# Patient Record
Sex: Female | Born: 1980 | Race: White | Hispanic: No | Marital: Married | Smoking: Never smoker
Health system: Southern US, Community
[De-identification: ages and names within clinical notes are randomized; demographics above are authoritative.]

## PROBLEM LIST (undated history)

## (undated) DIAGNOSIS — E039 Hypothyroidism, unspecified: Secondary | ICD-10-CM

## (undated) DIAGNOSIS — E282 Polycystic ovarian syndrome: Secondary | ICD-10-CM

## (undated) DIAGNOSIS — F329 Major depressive disorder, single episode, unspecified: Secondary | ICD-10-CM

## (undated) DIAGNOSIS — F32A Depression, unspecified: Secondary | ICD-10-CM

## (undated) DIAGNOSIS — F4310 Post-traumatic stress disorder, unspecified: Secondary | ICD-10-CM

## (undated) DIAGNOSIS — F419 Anxiety disorder, unspecified: Secondary | ICD-10-CM

## (undated) DIAGNOSIS — E785 Hyperlipidemia, unspecified: Secondary | ICD-10-CM

## (undated) DIAGNOSIS — T7840XA Allergy, unspecified, initial encounter: Secondary | ICD-10-CM

## (undated) DIAGNOSIS — E669 Obesity, unspecified: Secondary | ICD-10-CM

## (undated) HISTORY — DX: Obesity, unspecified: E66.9

## (undated) HISTORY — DX: Polycystic ovarian syndrome: E28.2

## (undated) HISTORY — DX: Hypothyroidism, unspecified: E03.9

## (undated) HISTORY — DX: Major depressive disorder, single episode, unspecified: F32.9

## (undated) HISTORY — DX: Allergy, unspecified, initial encounter: T78.40XA

## (undated) HISTORY — DX: Anxiety disorder, unspecified: F41.9

## (undated) HISTORY — DX: Depression, unspecified: F32.A

## (undated) HISTORY — DX: Post-traumatic stress disorder, unspecified: F43.10

## (undated) HISTORY — DX: Hyperlipidemia, unspecified: E78.5

---

## 2003-07-23 HISTORY — PX: COLONOSCOPY: SHX174

## 2010-07-22 HISTORY — PX: SHOULDER SURGERY: SHX246

## 2013-06-03 ENCOUNTER — Ambulatory Visit (INDEPENDENT_AMBULATORY_CARE_PROVIDER_SITE_OTHER): Payer: Managed Care, Other (non HMO) | Admitting: Family Medicine

## 2013-06-03 VITALS — BP 142/92 | HR 78 | Temp 98.2°F | Resp 16 | Ht 66.0 in | Wt 310.0 lb

## 2013-06-03 DIAGNOSIS — R21 Rash and other nonspecific skin eruption: Secondary | ICD-10-CM

## 2013-06-03 DIAGNOSIS — L304 Erythema intertrigo: Secondary | ICD-10-CM

## 2013-06-03 DIAGNOSIS — E039 Hypothyroidism, unspecified: Secondary | ICD-10-CM

## 2013-06-03 DIAGNOSIS — L538 Other specified erythematous conditions: Secondary | ICD-10-CM

## 2013-06-03 LAB — GLUCOSE, POCT (MANUAL RESULT ENTRY): POC Glucose: 73 mg/dl (ref 70–99)

## 2013-06-03 MED ORDER — FLUCONAZOLE 100 MG PO TABS: 150.0000 mg | ORAL_TABLET | Freq: Once | ORAL | Status: DC

## 2013-06-03 MED ORDER — DOXYCYCLINE HYCLATE 100 MG PO CAPS: 100.0000 mg | ORAL_CAPSULE | Freq: Two times a day (BID) | ORAL | Status: DC

## 2013-06-03 NOTE — Progress Notes (Signed)
Subjective: Patient has a history of hypothyroidism. She recently moved here from [place] when her husband got a job here. She does not work currently. She is doing well on her medicine but needs a new prescription she has a rash between her breasts which she's had for some time off and on. She also has a red place on the lateral aspect of the right breast which is concerned now.  Objective: Her neck supple without nodes thyromegaly. Breasts are symmetrical, full. No axillary nodes. She has a intertriginous rash between her breasts. She has a few little specks of folliculitis on the lateral aspect of left breast. Right breast has a 1.5 CM area of erythema and mild thickening with a little or in the Center of it in the right breast at about the 9:00 position. He does not feel like a form of cyst, though it looks like an inflamed pore.  Assessment: Hypothyroidism Erythematous rash right breast Intertrigo  Plan: TSH, hemoglobin A1c, glucose Results for orders placed in visit on 06/03/13  GLUCOSE, POCT (MANUAL RESULT ENTRY)      Result Value Range   POC Glucose 73  70 - 99 mg/dl  POCT GLYCOSYLATED HEMOGLOBIN (HGB A1C)      Result Value Range   Hemoglobin A1C 5.2     Will treat the fungal infection with antifungal's Will treat the red area on the right breast with some cephalexin. This may be an inflamed. This caused by some bacterial agent. It hasn't yet formed a cyst. However if the place is not clear on up over the next 5-7 days she's been cautioned to come back in for a recheck so we don't miss anything. Continue same thyroid

## 2013-06-03 NOTE — Patient Instructions (Signed)
Take the doxycycline one twice daily for the red spot on your breast. If it is not doing better by next week return for recheck.  Take the Diflucan one daily for a rash between breasts. You may also use a little Lotrimin (clotrimazole) or Mycelex(miconazole) cream on the rash   Continue same thyroid medication

## 2013-06-04 LAB — TSH: TSH: 4.003 u[IU]/mL (ref 0.350–4.500)

## 2013-06-05 MED ORDER — LEVOTHYROXINE SODIUM 125 MCG PO TABS: 125.0000 ug | ORAL_TABLET | Freq: Every day | ORAL | Status: DC

## 2013-06-05 NOTE — Addendum Note (Signed)
Addended by: HOPPER, DAVID H on: 06/05/2013 04:46 PM   Modules accepted: Orders

## 2013-07-01 ENCOUNTER — Ambulatory Visit (INDEPENDENT_AMBULATORY_CARE_PROVIDER_SITE_OTHER): Payer: Managed Care, Other (non HMO) | Admitting: Internal Medicine

## 2013-07-01 VITALS — BP 130/82 | HR 71 | Temp 98.0°F | Resp 18 | Ht 66.0 in | Wt 308.0 lb

## 2013-07-01 DIAGNOSIS — J329 Chronic sinusitis, unspecified: Secondary | ICD-10-CM

## 2013-07-01 DIAGNOSIS — R05 Cough: Secondary | ICD-10-CM

## 2013-07-01 MED ORDER — AZITHROMYCIN 500 MG PO TABS: 500.0000 mg | ORAL_TABLET | Freq: Every day | ORAL | Status: DC

## 2013-07-01 NOTE — Progress Notes (Signed)
   Subjective:    Patient ID: Tracie Johnston, female    DOB: [birth year], 32 y.o.   MRN: [id number]  HPI    Review of Systems     Objective:   Physical Exam        Assessment & Plan:

## 2013-07-01 NOTE — Progress Notes (Signed)
   Subjective:    Patient ID: Tracie Johnston, female    DOB: Dec 11, 1980, 32 y.o.   MRN: [id number]  Sinusitis This is a new problem. Episode onset: started 4 days ago. The problem has been gradually worsening since onset. There has been no fever. Associated symptoms include congestion, coughing, headaches, sinus pressure, a sore throat and swollen glands. Past treatments include oral decongestants. The treatment provided no relief.   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  HENT: Positive for congestion, postnasal drip, rhinorrhea, sinus pressure, sore throat and trouble swallowing.   Respiratory: Positive for cough. Negative for wheezing.   Gastrointestinal: Negative for nausea, vomiting and diarrhea.  Neurological: Positive for headaches.       Objective:   Physical Exam  Vitals reviewed. Constitutional: She is oriented to person, place, and time. She appears well-nourished. No distress.  HENT:  Head: Normocephalic.  Right Ear: External ear normal.  Left Ear: External ear normal.  Nose: Mucosal edema, rhinorrhea and sinus tenderness present. Right sinus exhibits maxillary sinus tenderness. Right sinus exhibits no frontal sinus tenderness. Left sinus exhibits maxillary sinus tenderness. Left sinus exhibits no frontal sinus tenderness.  Mouth/Throat: Oropharynx is clear and moist.  Cardiovascular: Normal rate.   Pulmonary/Chest: Effort normal. No respiratory distress. She has wheezes. She exhibits no tenderness.  Musculoskeletal: Normal range of motion.  Neurological: She is alert and oriented to person, place, and time. She exhibits normal muscle tone. Coordination normal.  Psychiatric: She has a normal mood and affect.          Assessment & Plan:  Sinusitis Zithromax 500mg

## 2013-07-05 ENCOUNTER — Ambulatory Visit: Payer: Self-pay | Admitting: Internal Medicine

## 2013-08-13 ENCOUNTER — Ambulatory Visit: Payer: Self-pay | Admitting: Internal Medicine

## 2013-08-18 ENCOUNTER — Encounter: Payer: Self-pay | Admitting: Internal Medicine

## 2013-08-18 ENCOUNTER — Ambulatory Visit (INDEPENDENT_AMBULATORY_CARE_PROVIDER_SITE_OTHER): Payer: Managed Care, Other (non HMO) | Admitting: Internal Medicine

## 2013-08-18 VITALS — BP 142/90 | HR 100 | Temp 98.0°F | Ht 66.0 in | Wt 316.0 lb

## 2013-08-18 DIAGNOSIS — E039 Hypothyroidism, unspecified: Secondary | ICD-10-CM

## 2013-08-18 DIAGNOSIS — N926 Irregular menstruation, unspecified: Secondary | ICD-10-CM

## 2013-08-18 DIAGNOSIS — Z0000 Encounter for general adult medical examination without abnormal findings: Secondary | ICD-10-CM

## 2013-08-18 DIAGNOSIS — E282 Polycystic ovarian syndrome: Secondary | ICD-10-CM | POA: Insufficient documentation

## 2013-08-18 MED ORDER — METFORMIN HCL ER (MOD) 500 MG PO TB24: 500.0000 mg | ORAL_TABLET | Freq: Every day | ORAL | Status: DC

## 2013-08-18 MED ORDER — LEVOTHYROXINE SODIUM 150 MCG PO TABS: 150.0000 ug | ORAL_TABLET | Freq: Every day | ORAL | Status: DC

## 2013-08-18 NOTE — Patient Instructions (Addendum)
Monitor your blood pressure 3-4 times per week with automated BP cuff Complete blood work 2-3 days before next office visit

## 2013-08-18 NOTE — Assessment & Plan Note (Signed)
Patient has history of PCOS. She has tried regular metformin in the past but could not tolerate due to gastrointestinal side effects. Trial of Glumetza. Samples provided. Patient requests referral to gynecologist. Her and her husband are to trying to conceive.

## 2013-08-18 NOTE — Assessment & Plan Note (Signed)
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 Her recent TSH was slightly greater than 4. Increase levothyroxine dose to 150 mcg once daily. Arrange followup TSH in 6-8 weeks.

## 2013-08-18 NOTE — Progress Notes (Signed)
Subjective:    Patient ID: Tracie ReichertLisa Schwalb, female    DOB: 1980/10/16, 33 y.o.   MRN: [id number]  HPI  33 year old white female with history of hypothyroidism and morbid obesity to establish. Patient reports being diagnosed with hypothyroidism 34 years ago. 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 She is currently taking 125 mcg of levothyroxine.  She also reports history of irregular periods and diagnosis of polycystic ovarian syndrome. Her recent A1c was unremarkable. Her previous PCP tried metformin but she couldn't tolerate secondary to gastrointestinal side effects.  Her menstrual cycles are irregular. She and her husband are trying to conceive.  Patient also complains of history of compressive neuropathy. When she sits for too long on a hard surface she gets bilateral pins and needles in her feet. She reports undergoing nerve conduction study.  She denies chronic low back pain.   Review of Systems  Constitutional: Negative for activity change, appetite change and unexpected weight change.  Eyes: Negative for visual disturbance.  Respiratory: Negative for cough, chest tightness and shortness of breath.   Cardiovascular: Negative for chest pain.  Genitourinary: Negative for difficulty urinating.  Neurological: Negative for headaches.  Gastrointestinal: Negative for abdominal pain, heartburn melena or hematochezia Psych: Negative for depression or anxiety Endo:  No polyuria or polydypsia        Past Medical History  Diagnosis Date  . Allergy   . Thyroid disease   . Hypothyroid   . Anxiety   . PCOS (polycystic ovarian syndrome)   . Hyperlipidemia     History   Social History  . Marital Status: Married    Spouse Name: N/A    Number of Children: N/A  . Years of Education: N/A   Occupational History  . Not on file.   Social History Main Topics  . Smoking status: Never Smoker   . Smokeless tobacco: Not on file  . Alcohol Use: Yes  . Drug Use: No  . Sexual  Activity: Not on file   Other Topics Concern  . Not on file   Social History Narrative  . No narrative on file    Past Surgical History  Procedure Laterality Date  . Shoulder surgery      Family History  Problem Relation Age of Onset  . Kidney cancer Maternal Grandmother   . Hyperlipidemia Maternal Grandfather   . Heart disease Maternal Grandfather   . Skin cancer Maternal Grandfather   . Prostate cancer Maternal Grandfather   . Colon cancer Maternal Grandfather   . Diabetes Paternal Grandmother     Allergies  Allergen Reactions  . Penicillins Hives  . Sulfa Antibiotics Hives    No current outpatient prescriptions on file prior to visit.   No current facility-administered medications on file prior to visit.    BP 142/90  Pulse 100  Temp(Src) 98 F (36.7 C) (Oral)  Ht 5\' 6"  (1.676 m)  Wt 316 lb (143.337 kg)  BMI 51.03 kg/m2    Objective:   Physical Exam  Constitutional: She is oriented to person, place, and time. She appears well-developed and well-nourished. No distress.  Pleasant, obese  HENT:  Head: Normocephalic and atraumatic.  Right Ear: External ear normal.  Left Ear: External ear normal.  Mouth/Throat: Oropharynx is clear and moist.  Eyes: Conjunctivae and EOM are normal. Pupils are equal, round, and reactive to light.  No defects in peripheral vision  Neck: Neck supple.  Cardiovascular: Normal rate, regular rhythm, normal heart sounds and intact distal pulses.  No murmur heard. Pulmonary/Chest: Effort normal and breath sounds normal. She has no wheezes.  Abdominal: Soft. Bowel sounds are normal. She exhibits no mass. There is no tenderness.  Musculoskeletal: She exhibits no edema.  Lymphadenopathy:    She has no cervical adenopathy.  Neurological: She is alert and oriented to person, place, and time. She has normal reflexes. She displays normal reflexes. No cranial nerve deficit. She exhibits normal muscle tone. Coordination normal.  Skin:  Skin is warm and dry. No erythema.  Psychiatric: She has a normal mood and affect. Her behavior is normal.          Assessment & Plan:

## 2013-08-23 ENCOUNTER — Telehealth: Payer: Self-pay | Admitting: Internal Medicine

## 2013-08-23 DIAGNOSIS — R7401 Elevation of levels of liver transaminase levels: Secondary | ICD-10-CM

## 2013-08-23 DIAGNOSIS — R740 Nonspecific elevation of levels of transaminase and lactic acid dehydrogenase [LDH]: Principal | ICD-10-CM

## 2013-08-23 NOTE — Telephone Encounter (Signed)
Fax [phone] [facility] Pt's info from dr Artist Paisyoo

## 2014-01-22 ENCOUNTER — Ambulatory Visit (INDEPENDENT_AMBULATORY_CARE_PROVIDER_SITE_OTHER): Payer: Managed Care, Other (non HMO) | Admitting: Internal Medicine

## 2014-01-22 VITALS — BP 128/82 | HR 83 | Temp 98.3°F | Resp 16 | Ht 66.5 in | Wt 310.0 lb

## 2014-01-22 DIAGNOSIS — M79643 Pain in unspecified hand: Secondary | ICD-10-CM

## 2014-01-22 DIAGNOSIS — M79609 Pain in unspecified limb: Secondary | ICD-10-CM

## 2014-01-22 DIAGNOSIS — M2550 Pain in unspecified joint: Secondary | ICD-10-CM

## 2014-01-22 DIAGNOSIS — E039 Hypothyroidism, unspecified: Secondary | ICD-10-CM

## 2014-01-22 MED ORDER — MELOXICAM 15 MG PO TABS: 15.0000 mg | ORAL_TABLET | Freq: Every day | ORAL | Status: DC

## 2014-01-22 MED ORDER — HYDROCODONE-ACETAMINOPHEN 5-325 MG PO TABS
1.0000 | ORAL_TABLET | Freq: Four times a day (QID) | ORAL | Status: DC | PRN
Start: 2014-01-22 — End: 2014-02-11

## 2014-01-22 NOTE — Progress Notes (Addendum)
Subjective:  This chart was scribed for Tracie Lin, MD by [name], Medial Scribe. This patient was seen in room 1 and the patient's care was started at 4:14 PM.    Patient ID: Tracie Johnston, female    DOB: Dec 19, 1980, 33 y.o.   MRN: [id number]  HPI HPI Comments: Tracie Johnston is a 33 y.o. female who presents to the [facility] complaining of  Chief Complaint  Patient presents with  . Ankle Pain    1 month  . Wrist Pain  . 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 She has had some swelling in the ankles this week. The pain is so severe it keeps her up at night. No history of arthristis or gout. No redness. No known injury. She feels her pain is directly related to how hard she has been working.   History of hemorrhoids. Patient reports single occurrence of hematochezia prior to arrival. Patient shares that she has been taking a lot of ibuprofen to manage her pain.    Patient Active Problem List   Diagnosis Date Noted  . Hypothyroidism 08/18/2013  . PCOS (polycystic ovarian syndrome) 08/18/2013  . Morbid obesity 08/18/2013   Past Medical History  Diagnosis Date  . Allergy   . Hypothyroid   . Anxiety   . PCOS (polycystic ovarian syndrome)   . Hyperlipidemia    Past Surgical History  Procedure Laterality Date  . Shoulder surgery  2012    Repair of torn labrum  . Colonoscopy  2005    Normal. IBS   Allergies  Allergen Reactions  . Penicillins Hives  . Sulfa Antibiotics Hives   Prior to Admission medications   Medication Sig Start Date End Date Taking? Authorizing Provider  levothyroxine (SYNTHROID, LEVOTHROID) 150 MCG tablet Take 1 tablet (150 mcg total) by mouth daily. 08/18/13  Yes Doe-Hyun R Shawna Orleans, DO  metFORMIN (GLUMETZA) 500 MG (MOD) 24 hr tablet Take 1 tablet (500 mg total) by mouth daily with  breakfast. 08/18/13   Doe-Hyun Kyra Searles, DO   History   Social History  . Marital Status: Married    Spouse Name: N/A    Number of Children: N/A  . Years of Education: N/A   Occupational History  . Not on file.   Social History Main Topics  . Smoking status: Never Smoker   . Smokeless tobacco: Not on file  . Alcohol Use: Yes  . Drug Use: No  . Sexual Activity: Not on file   Other Topics Concern  . Not on file   Social History Narrative  . No narrative on file       Review of Systems  Constitutional: Negative for fever.  Gastrointestinal: Positive for blood in stool. Negative for diarrhea and rectal pain.  Musculoskeletal: Positive for arthralgias and back pain.       Objective:   Physical Exam  Nursing note and vitals reviewed. Constitutional: She is oriented to person, place, and time. She appears well-developed and well-nourished. No distress.  Morbid obesity.   HENT:  Head: Normocephalic and atraumatic.  Eyes: EOM are normal.  Neck: Neck supple. No thyromegaly present.  Cardiovascular: Normal rate.   Pulmonary/Chest: Effort normal. No respiratory distress.  Musculoskeletal: Normal range of motion.  Both ankles are very tender to palpation and ROM without instabiliy. Achilles tender  without enlargement. Not joint effusion or erythema. Calves and thighs are nontender and not swollen. No peripheral edema. Good peripheral pulpes. Knee and hips full ROM without pain. Uncomfortable lying on back in lumbar area but has negative straight leg raise. Wrists are tender to ROM without swelling of redness. Tender at first [facility] joint on left. No bony changes in the hands. Neck and shoulders move freely.   Neurological: She is alert and oriented to person, place, and time.  Skin: Skin is warm and dry.  Psychiatric: She has a normal mood and affect. Her behavior is normal.    Filed Vitals:   01/22/14 1559  BP: 128/82  Pulse: 83  Temp: 98.3 F (36.8 C)  Resp: 16           Assessment & Plan:  I have completed the patient encounter in its entirety as documented by the scribe, with editing by me where necessary. Robert P. Laney Pastor, M.D. Arthralgias-ankles and wrists - Plan: CBC with Differential, ANA, Rheumatoid factor, TSH + free T4, Comprehensive metabolic panel, Uric acid, Sedimentation rate, CANCELED: POCT SEDIMENTATION RATE  Unspecified hypothyroidism  Pain of hand, unspecified laterality  Morbid obesity is part of prob here as is job dissatisfac  Meds ordered this encounter  Medications  . HYDROcodone-acetaminophen (NORCO/VICODIN) 5-325 MG per tablet    Sig: Take 1 tablet by mouth every 6 (six) hours as needed for moderate pain. espec hs    Dispense:  12 tablet    Refill:  0  . meloxicam (MOBIC) 15 MG tablet    Sig: Take 1 tablet (15 mg total) by mouth daily. For joint pain    Dispense:  30 tablet    Refill:  0   Will allow rest +meds and then f/u for plan for conditionong  Results for orders placed in visit on 01/22/14  CBC WITH DIFFERENTIAL      Result Value Ref Range   WBC 8.7  4.0 - 10.5 K/uL   RBC 5.22 (*) 3.87 - 5.11 MIL/uL   Hemoglobin 14.8  12.0 - 15.0 g/dL   HCT 43.6  36.0 - 46.0 %   MCV 83.5  78.0 - 100.0 fL   MCH 28.4  26.0 - 34.0 pg   MCHC 33.9  30.0 - 36.0 g/dL   RDW 14.5  11.5 - 15.5 %   Platelets 339  150 - 400 K/uL   Neutrophils Relative % 64  43 - 77 %   Neutro Abs 5.6  1.7 - 7.7 K/uL   Lymphocytes Relative 20  12 - 46 %   Lymphs Abs 1.7  0.7 - 4.0 K/uL   Monocytes Relative 10  3 - 12 %   Monocytes Absolute 0.9  0.1 - 1.0 K/uL   Eosinophils Relative 5  0 - 5 %   Eosinophils Absolute 0.4  0.0 - 0.7 K/uL   Basophils Relative 1  0 - 1 %   Basophils Absolute 0.1  0.0 - 0.1 K/uL   Smear Review Criteria for review not met    ANA      Result Value Ref Range   ANA    NEGATIVE  RHEUMATOID FACTOR      Result Value Ref Range   Rheumatoid Factor <10  <=14 IU/mL  COMPREHENSIVE METABOLIC PANEL      Result Value Ref Range    Sodium 139  135 - 145 mEq/L   Potassium 4.1  3.5 - 5.3 mEq/L   Chloride 101  96 - 112  mEq/L   CO2 26  19 - 32 mEq/L   Glucose, Bld 84  70 - 99 mg/dL   BUN 13  6 - 23 mg/dL   Creat 0.66  0.50 - 1.10 mg/dL   Total Bilirubin 0.6  0.2 - 1.2 mg/dL   Alkaline Phosphatase 89  39 - 117 U/L   AST 25  0 - 37 U/L   ALT 34  0 - 35 U/L   Total Protein 6.8  6.0 - 8.3 g/dL   Albumin 4.1  3.5 - 5.2 g/dL   Calcium 9.5  8.4 - 10.5 mg/dL  URIC ACID      Result Value Ref Range   Uric Acid, Serum 7.0  2.4 - 7.0 mg/dL  SEDIMENTATION RATE      Result Value Ref Range   Sed Rate 20  0 - 22 mm/hr   I have completed the patient encounter in its entirety as documented by the scribe, with editing by me where necessary. Robert P. Laney Pastor, M.D.

## 2014-01-23 LAB — CBC WITH DIFFERENTIAL/PLATELET
BASOS PCT: 1 % (ref 0–1)
Basophils Absolute: 0.1 10*3/uL (ref 0.0–0.1)
Eosinophils Absolute: 0.4 10*3/uL (ref 0.0–0.7)
Eosinophils Relative: 5 % (ref 0–5)
HEMATOCRIT: 43.6 % (ref 36.0–46.0)
Hemoglobin: 14.8 g/dL (ref 12.0–15.0)
Lymphocytes Relative: 20 % (ref 12–46)
Lymphs Abs: 1.7 10*3/uL (ref 0.7–4.0)
MCH: 28.4 pg (ref 26.0–34.0)
MCHC: 33.9 g/dL (ref 30.0–36.0)
MCV: 83.5 fL (ref 78.0–100.0)
MONO ABS: 0.9 10*3/uL (ref 0.1–1.0)
MONOS PCT: 10 % (ref 3–12)
Neutro Abs: 5.6 10*3/uL (ref 1.7–7.7)
Neutrophils Relative %: 64 % (ref 43–77)
Platelets: 339 10*3/uL (ref 150–400)
RBC: 5.22 MIL/uL — ABNORMAL HIGH (ref 3.87–5.11)
RDW: 14.5 % (ref 11.5–15.5)
WBC: 8.7 10*3/uL (ref 4.0–10.5)

## 2014-01-23 LAB — COMPREHENSIVE METABOLIC PANEL
ALK PHOS: 89 U/L (ref 39–117)
ALT: 34 U/L (ref 0–35)
AST: 25 U/L (ref 0–37)
Albumin: 4.1 g/dL (ref 3.5–5.2)
BILIRUBIN TOTAL: 0.6 mg/dL (ref 0.2–1.2)
BUN: 13 mg/dL (ref 6–23)
CO2: 26 meq/L (ref 19–32)
Calcium: 9.5 mg/dL (ref 8.4–10.5)
Chloride: 101 mEq/L (ref 96–112)
Creat: 0.66 mg/dL (ref 0.50–1.10)
GLUCOSE: 84 mg/dL (ref 70–99)
Potassium: 4.1 mEq/L (ref 3.5–5.3)
Sodium: 139 mEq/L (ref 135–145)
Total Protein: 6.8 g/dL (ref 6.0–8.3)

## 2014-01-23 LAB — SEDIMENTATION RATE: SED RATE: 20 mm/h (ref 0–22)

## 2014-01-23 LAB — RHEUMATOID FACTOR

## 2014-01-23 LAB — URIC ACID: Uric Acid, Serum: 7 mg/dL (ref 2.4–7.0)

## 2014-01-24 LAB — ANA: Anti Nuclear Antibody(ANA): NEGATIVE

## 2014-01-25 ENCOUNTER — Encounter: Payer: Self-pay | Admitting: Internal Medicine

## 2014-01-26 ENCOUNTER — Ambulatory Visit (INDEPENDENT_AMBULATORY_CARE_PROVIDER_SITE_OTHER): Payer: Managed Care, Other (non HMO) | Admitting: Emergency Medicine

## 2014-01-26 VITALS — BP 158/81 | HR 92 | Temp 98.2°F | Resp 18 | Wt 310.0 lb

## 2014-01-26 DIAGNOSIS — M2550 Pain in unspecified joint: Secondary | ICD-10-CM

## 2014-01-26 MED ORDER — NAPROXEN SODIUM 550 MG PO TABS: 550.0000 mg | ORAL_TABLET | Freq: Two times a day (BID) | ORAL | Status: DC

## 2014-01-26 NOTE — Patient Instructions (Signed)

## 2014-01-26 NOTE — Progress Notes (Signed)
[facility] [address], [place] [address] [phone]  Date:  01/26/2014   Name:  Tracie ReichertLisa Johnston   DOB:  19-May-1981   MRN:  [id number]  PCP:  Thomos Lemonsobert Yoo, DO    Chief Complaint: arthralgias   History of Present Illness:  Tracie ReichertLisa Johnston is a 33 y.o. 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  At start of job she developed polyarthralgias.  No rash, swelling or bruising.  No fever or chills.  Subsequently quit her job.  She was seen on 7/4 by Dr Merla Richesoolittle and had a panel of labs done, all returned normal.  She is out of vicodin and is not receiving any benefit from the meloxicam.  No history of injury.  No improvement with over the counter medications or other home remedies. Denies other complaint or health concern today.   Patient Active Problem List   Diagnosis Date Noted  . Hypothyroidism 08/18/2013  . PCOS (polycystic ovarian syndrome) 08/18/2013  . Morbid obesity 08/18/2013    Past Medical History  Diagnosis Date  . Allergy   . Hypothyroid   . Anxiety   . PCOS (polycystic ovarian syndrome)   . Hyperlipidemia     Past Surgical History  Procedure Laterality Date  . Shoulder surgery  2012    Repair of torn labrum  . Colonoscopy  2005    Normal. IBS    History  Substance Use Topics  . Smoking status: Never Smoker   . Smokeless tobacco: Not on file  . Alcohol Use: Yes    Family History  Problem Relation Age of Onset  . Kidney cancer Maternal Grandmother   . Hyperlipidemia Maternal Grandfather   . Heart disease Maternal Grandfather   . Skin cancer Maternal Grandfather   . Prostate cancer Maternal Grandfather   . Colon cancer Maternal Grandfather   . Diabetes Paternal Grandmother     Allergies  Allergen Reactions  . Penicillins Hives  . Sulfa Antibiotics Hives    Medication list has been reviewed and updated.  Current Outpatient Prescriptions on File Prior to Visit   Medication Sig Dispense Refill  . HYDROcodone-acetaminophen (NORCO/VICODIN) 5-325 MG per tablet Take 1 tablet by mouth every 6 (six) hours as needed for moderate pain. espec hs  12 tablet  0  . levothyroxine (SYNTHROID, LEVOTHROID) 150 MCG tablet Take 1 tablet (150 mcg total) by mouth daily.  90 tablet  1  . meloxicam (MOBIC) 15 MG tablet Take 1 tablet (15 mg total) by mouth daily. For joint pain  30 tablet  0  . metFORMIN (GLUMETZA) 500 MG (MOD) 24 hr tablet Take 1 tablet (500 mg total) by mouth daily with breakfast.  60 tablet  0   No current facility-administered medications on file prior to visit.    Review of Systems:  As per HPI, otherwise negative.    Physical Examination: Filed Vitals:   01/26/14 1814  BP: 158/81  Pulse: 92  Temp: 98.2 F (36.8 C)  Resp: 18   Filed Vitals:   01/26/14 1814  Weight: 310 lb (140.615 kg)   Body mass index is 49.29 kg/(m^2). Ideal Body Weight:     GEN: WDWN, NAD, Non-toxic, Alert & Oriented x 3 HEENT: Atraumatic, Normocephalic.  Ears and Nose: No external deformity. EXTR: No clubbing/cyanosis/edema NEURO: Normal gait.  PSYCH: Normally interactive. Conversant. Not depressed or anxious appearing.  Calm demeanor.    Assessment  and Plan: polyarthralgias Anaprox Labs   Signed,  Phillips OdorJeffery Trevan Messman, MD

## 2014-01-28 LAB — ROCKY MTN SPOTTED FVR AB, IGM-BLOOD: ROCKY MTN SPOTTED FEVER, IGM: 0.17 IV

## 2014-01-28 LAB — LYME AB/WESTERN BLOT REFLEX: B burgdorferi Ab IgG+IgM: 0.57 {ISR}

## 2014-02-11 ENCOUNTER — Ambulatory Visit (INDEPENDENT_AMBULATORY_CARE_PROVIDER_SITE_OTHER): Payer: Managed Care, Other (non HMO) | Admitting: Family Medicine

## 2014-02-11 ENCOUNTER — Encounter: Payer: Self-pay | Admitting: Family Medicine

## 2014-02-11 VITALS — BP 122/78 | HR 92 | Temp 98.1°F | Ht 66.5 in | Wt 308.0 lb

## 2014-02-11 DIAGNOSIS — M25539 Pain in unspecified wrist: Secondary | ICD-10-CM

## 2014-02-11 DIAGNOSIS — M25531 Pain in right wrist: Secondary | ICD-10-CM

## 2014-02-11 DIAGNOSIS — Z0000 Encounter for general adult medical examination without abnormal findings: Secondary | ICD-10-CM

## 2014-02-11 DIAGNOSIS — E039 Hypothyroidism, unspecified: Secondary | ICD-10-CM

## 2014-02-11 DIAGNOSIS — M25532 Pain in left wrist: Secondary | ICD-10-CM

## 2014-02-11 DIAGNOSIS — M25579 Pain in unspecified ankle and joints of unspecified foot: Secondary | ICD-10-CM

## 2014-02-11 DIAGNOSIS — E282 Polycystic ovarian syndrome: Secondary | ICD-10-CM

## 2014-02-11 LAB — HEPATIC FUNCTION PANEL
ALT: 38 U/L — AB (ref 0–35)
AST: 22 U/L (ref 0–37)
Albumin: 3.9 g/dL (ref 3.5–5.2)
Alkaline Phosphatase: 83 U/L (ref 39–117)
Bilirubin, Direct: 0.1 mg/dL (ref 0.0–0.3)
Total Bilirubin: 0.8 mg/dL (ref 0.2–1.2)
Total Protein: 7.2 g/dL (ref 6.0–8.3)

## 2014-02-11 LAB — BASIC METABOLIC PANEL
BUN: 14 mg/dL (ref 6–23)
CALCIUM: 9.2 mg/dL (ref 8.4–10.5)
CHLORIDE: 102 meq/L (ref 96–112)
CO2: 28 mEq/L (ref 19–32)
CREATININE: 0.7 mg/dL (ref 0.4–1.2)
GFR: 103.17 mL/min (ref >60.00)
Glucose, Bld: 79 mg/dL (ref 70–99)
Potassium: 3.7 mEq/L (ref 3.5–5.1)
Sodium: 137 mEq/L (ref 135–145)

## 2014-02-11 LAB — CBC WITH DIFFERENTIAL/PLATELET
Basophils Absolute: 0 10*3/uL (ref 0.0–0.1)
Basophils Relative: 0.3 % (ref 0.0–3.0)
EOS PCT: 4.1 % (ref 0.0–5.0)
Eosinophils Absolute: 0.4 10*3/uL (ref 0.0–0.7)
HCT: 45.7 % (ref 36.0–46.0)
Hemoglobin: 15.3 g/dL — ABNORMAL HIGH (ref 12.0–15.0)
LYMPHS PCT: 21.5 % (ref 12.0–46.0)
Lymphs Abs: 2 10*3/uL (ref 0.7–4.0)
MCHC: 33.5 g/dL (ref 30.0–36.0)
MCV: 85.3 fl (ref 78.0–100.0)
Monocytes Absolute: 0.7 10*3/uL (ref 0.1–1.0)
Monocytes Relative: 7.8 % (ref 3.0–12.0)
NEUTROS PCT: 66.3 % (ref 43.0–77.0)
Neutro Abs: 6.1 10*3/uL (ref 1.4–7.7)
Platelets: 365 10*3/uL (ref 150.0–400.0)
RBC: 5.36 Mil/uL — AB (ref 3.87–5.11)
RDW: 13.8 % (ref 11.5–15.5)
WBC: 9.3 10*3/uL (ref 4.0–10.5)

## 2014-02-11 LAB — LIPID PANEL
CHOLESTEROL: 216 mg/dL — AB (ref 0–200)
HDL: 29.1 mg/dL — ABNORMAL LOW (ref >39.00)
LDL Cholesterol: 158 mg/dL — ABNORMAL HIGH (ref 0–99)
NonHDL: 186.9
TRIGLYCERIDES: 147 mg/dL (ref 0.0–149.0)
Total CHOL/HDL Ratio: 7
VLDL: 29.4 mg/dL (ref 0.0–40.0)

## 2014-02-11 LAB — TSH: TSH: 1.17 u[IU]/mL (ref 0.35–4.50)

## 2014-02-11 LAB — FOLLICLE STIMULATING HORMONE: FSH: 5.7 m[IU]/mL

## 2014-02-11 LAB — LUTEINIZING HORMONE: LH: 4.95 m[IU]/mL

## 2014-02-11 NOTE — Progress Notes (Signed)
Pre visit review using our clinic review tool, if applicable. No additional management support is needed unless otherwise documented below in the visit note. 

## 2014-02-11 NOTE — Progress Notes (Signed)
No chief complaint on file.   HPI:  Tracie Johnston, is a 33 yo F patient of Dr. Shawna Orleans presenting for acute visit for:  Pain in Ankles: -started after new active job at [employer] a month ago in the heat that she is not used to -seen by Dr. Ouida Sills and Dr. Laney Pastor over the last month requesting pain medicine -has extensive laboratory evaluation with CBC, CMP, TFT (ordered but not done), ESR, Uric acid, ANA, RF, Lyme and RMSF serology all ok -reports today: doing better, swelling has improved, reports has a bump on each foot she wants me to look at, reports has pain in both thumbs and wrist -reports chronic intermittent issues with knees and plantar fasciitis -denies: fevers, malaise, HA, rash, FH rheumatoid disease, depression  Hypothyroidism:  -wants to check labs as reports was not done -hx of hypothyroidism -on synthroid -denies: fevers, heat or cold intolerance, skin changes  ROS: See pertinent positives and negatives per HPI.  Past Medical History  Diagnosis Date  . Allergy   . Hypothyroid   . Anxiety   . PCOS (polycystic ovarian syndrome)   . Hyperlipidemia     Past Surgical History  Procedure Laterality Date  . Shoulder surgery  2012    Repair of torn labrum  . Colonoscopy  2005    Normal. IBS    Family History  Problem Relation Age of Onset  . Kidney cancer Maternal Grandmother   . Hyperlipidemia Maternal Grandfather   . Heart disease Maternal Grandfather   . Skin cancer Maternal Grandfather   . Prostate cancer Maternal Grandfather   . Colon cancer Maternal Grandfather   . Diabetes Paternal Grandmother     History   Social History  . Marital Status: Married    Spouse Name: N/A    Number of Children: N/A  . Years of Education: N/A   Social History Main Topics  . Smoking status: Never Smoker   . Smokeless tobacco: None  . Alcohol Use: Yes  . Drug Use: No  . Sexual Activity: None   Other Topics Concern  . None   Social History Narrative  . None     Current outpatient prescriptions:levothyroxine (SYNTHROID, LEVOTHROID) 150 MCG tablet, Take 1 tablet (150 mcg total) by mouth daily., Disp: 90 tablet, Rfl: 1;  naproxen sodium (ANAPROX DS) 550 MG tablet, Take 1 tablet (550 mg total) by mouth 2 (two) times daily with a meal., Disp: 40 tablet, Rfl: 0  EXAM:  Filed Vitals:   02/11/14 1334  BP: 122/78  Pulse: 92  Temp: 98.1 F (36.7 C)    Body mass index is 48.97 kg/(m^2).  GENERAL: vitals reviewed and listed above, alert, oriented, appears well hydrated and in no acute distress  HEENT: atraumatic, conjunttiva clear, no obvious abnormalities on inspection of external nose and ears  NECK: no obvious masses on inspection  LUNGS: clear to auscultation bilaterally, no wheezes, rales or rhonchi, good air movement  CV: HRRR, no peripheral edema  MS: moves all extremities without noticeable abnormality -normal gait, normal appearance of both ankles and wrists with normal ROM, strength and sensation, no laxity, mild tendonous TTP, bumps are normal bony anatomy  PSYCH: pleasant and cooperative, no obvious depression or anxiety  ASSESSMENT AND PLAN:  Discussed the following assessment and plan:  Hypothyroidism, unspecified hypothyroidism type  Pain in both wrists  Pain in joint, ankle and foot, unspecified laterality  Preventative health care - Plan: Hepatic function panel, Lipid panel, Basic metabolic panel, CBC with  Differential  Unspecified hypothyroidism - Plan: TSH  PCOS (polycystic ovarian syndrome) - Plan: Luteinizing Hormone, Follicle Stimulating Hormone  -looks like has standing order in for thyroid check with PCP, will order if lab does not honor --we discussed possible serious and likely etiologies, workup and treatment, treatment risks and return precautions for the ankle and wrist pain that seems to be improving and think is more related to weight and new activity level -after this discussion, Asuzena opted for HEP  for both, strassburg sock, wrist splint with close follow up and possibly referral to rheum if persistent pain in multiple areas -of course, we advised Meagan  to return or notify a doctor immediately if symptoms worsen or persist or new concerns arise.  .  -Patient advised to return or notify a doctor immediately if symptoms worsen or persist or new concerns arise.  Patient Instructions  -try strassburg sock for plantar fasciitis and tight gastrocs  -trial of wrist splints  -follow up with Dr. Shawna Orleans in about 1 month       KIM, Nickola Major.

## 2014-02-11 NOTE — Patient Instructions (Signed)
-  try strassburg sock for plantar fasciitis and tight gastrocs  -trial of wrist splints  -follow up with Dr. Artist PaisYoo in about 1 month

## 2014-03-11 ENCOUNTER — Ambulatory Visit (INDEPENDENT_AMBULATORY_CARE_PROVIDER_SITE_OTHER): Payer: Managed Care, Other (non HMO) | Admitting: Internal Medicine

## 2014-03-11 ENCOUNTER — Encounter: Payer: Self-pay | Admitting: Internal Medicine

## 2014-03-11 VITALS — BP 132/74 | Temp 98.1°F | Ht 66.5 in | Wt 304.0 lb

## 2014-03-11 DIAGNOSIS — E038 Other specified hypothyroidism: Secondary | ICD-10-CM

## 2014-03-11 DIAGNOSIS — E039 Hypothyroidism, unspecified: Secondary | ICD-10-CM

## 2014-03-11 DIAGNOSIS — M2550 Pain in unspecified joint: Secondary | ICD-10-CM

## 2014-03-11 DIAGNOSIS — E282 Polycystic ovarian syndrome: Secondary | ICD-10-CM

## 2014-03-11 DIAGNOSIS — R740 Nonspecific elevation of levels of transaminase and lactic acid dehydrogenase [LDH]: Secondary | ICD-10-CM

## 2014-03-11 DIAGNOSIS — R7402 Elevation of levels of lactic acid dehydrogenase (LDH): Secondary | ICD-10-CM | POA: Insufficient documentation

## 2014-03-11 NOTE — Assessment & Plan Note (Signed)
Refer to female GYN.  Patient advised to start prenatal vitamins.

## 2014-03-11 NOTE — Progress Notes (Signed)
   Subjective:    Patient ID: Tracie ReichertLisa Johnston, female    DOB: [birth year], 33 y.o.   MRN: [id number]  HPI  33 year old white female previously seen for unexplained joint pains for followup. Patient's workup was negative (including ANA, rheumatoid factor antibody, testing for Lyme's disease and [facility].)  Fortunately, patient's arthralgias symptoms resolved on their own. She has no swelling in her hands, wrists or ankles.  PCOS -patient's menstrual cycles are still irregular. She is trying to conceive. She would like referral to female gynecologist.  Hypothyroidism - TSH within normal range.  Blood test from July, 2015 showed minimally elevated ALT  Review of Systems Negative for fever or chills, no joint swelling or redness    Past Medical History  Diagnosis Date  . Allergy   . Hypothyroid   . Anxiety   . PCOS (polycystic ovarian syndrome)   . Hyperlipidemia     History   Social History  . Marital Status: Married    Spouse Name: N/A    Number of Children: N/A  . Years of Education: N/A   Occupational History  . Not on file.   Social History Main Topics  . Smoking status: Never Smoker   . Smokeless tobacco: Not on file  . Alcohol Use: Yes  . Drug Use: No  . Sexual Activity: Not on file   Other Topics Concern  . Not on file   Social History Narrative  . No narrative on file    Past Surgical History  Procedure Laterality Date  . Shoulder surgery  2012    Repair of torn labrum  . Colonoscopy  2005    Normal. IBS    Family History  Problem Relation Age of Onset  . Kidney cancer Maternal Grandmother   . Hyperlipidemia Maternal Grandfather   . Heart disease Maternal Grandfather   . Skin cancer Maternal Grandfather   . Prostate cancer Maternal Grandfather   . Colon cancer Maternal Grandfather   . Diabetes Paternal Grandmother     Allergies  Allergen Reactions  . Penicillins Hives  . Sulfa Antibiotics Hives    Current Outpatient  Prescriptions on File Prior to Visit  Medication Sig Dispense Refill  . levothyroxine (SYNTHROID, LEVOTHROID) 150 MCG tablet Take 1 tablet (150 mcg total) by mouth daily.  90 tablet  1  . naproxen sodium (ANAPROX DS) 550 MG tablet Take 1 tablet (550 mg total) by mouth 2 (two) times daily with a meal.  40 tablet  0   No current facility-administered medications on file prior to visit.    BP 132/74  Temp(Src) 98.1 F (36.7 C) (Oral)  Ht 5' 6.5" (1.689 m)  Wt 304 lb (137.893 kg)  BMI 48.34 kg/m2    Objective:   Physical Exam  Constitutional: She is oriented to person, place, and time. She appears well-developed and well-nourished.  Cardiovascular: Normal rate, regular rhythm and normal heart sounds.   Pulmonary/Chest: Effort normal and breath sounds normal. She has no wheezes.  Musculoskeletal: She exhibits no edema.  No joint swelling or redness  Neurological: She is alert and oriented to person, place, and time.  Skin: Skin is warm and dry.  Psychiatric: She has a normal mood and affect. Her behavior is normal.          Assessment & Plan:

## 2014-03-11 NOTE — Assessment & Plan Note (Signed)
Minimally elevated ALT likely secondary to NSAID use. Repeat liver function tests in 6 months.

## 2014-03-11 NOTE — Patient Instructions (Signed)
Please complete the following lab tests before your next follow up appointment: TSH - 244.9 LFTs - 790.4

## 2014-03-11 NOTE — Progress Notes (Signed)
Pre visit review using our clinic review tool, if applicable. No additional management support is needed unless otherwise documented below in the visit note. 

## 2014-03-11 NOTE — Assessment & Plan Note (Signed)
Patient's arthralgias or resolved on its own. Her symptoms were self-limited. We discussed possibility that her symptoms were secondary to post parvovirus infection.

## 2014-03-11 NOTE — Assessment & Plan Note (Signed)
TSH at goal. Monitor q 6 months.

## 2014-03-15 MED ORDER — LEVOTHYROXINE SODIUM 150 MCG PO TABS: 150.0000 ug | ORAL_TABLET | Freq: Every day | ORAL | Status: DC

## 2014-03-15 NOTE — Addendum Note (Signed)
Addended by: Alfred Levins D on: 03/15/2014 09:39 AM   Modules accepted: Orders

## 2014-05-09 ENCOUNTER — Ambulatory Visit (INDEPENDENT_AMBULATORY_CARE_PROVIDER_SITE_OTHER): Payer: Managed Care, Other (non HMO) | Admitting: Family Medicine

## 2014-05-09 ENCOUNTER — Encounter: Payer: Self-pay | Admitting: Family Medicine

## 2014-05-09 VITALS — BP 123/91 | HR 82 | Temp 98.0°F | Ht 66.5 in | Wt 302.0 lb

## 2014-05-09 DIAGNOSIS — J0100 Acute maxillary sinusitis, unspecified: Secondary | ICD-10-CM

## 2014-05-09 MED ORDER — AZITHROMYCIN 250 MG PO TABS: ORAL_TABLET | ORAL | Status: DC

## 2014-05-09 NOTE — Progress Notes (Signed)
Pre visit review using our clinic review tool, if applicable. No additional management support is needed unless otherwise documented below in the visit note. 

## 2014-05-09 NOTE — Progress Notes (Signed)
   Subjective:    Patient ID: Quintella ReichertLisa Clagg, female    DOB: 10-06-1980, 33 y.o.   MRN: [id number]  HPI Here for 4 days of sinus pressure, PND, HA, ST, and a dry cough.    Review of Systems  Constitutional: Negative.   HENT: Positive for congestion, postnasal drip and sinus pressure.   Eyes: Negative.   Respiratory: Positive for cough.        Objective:   Physical Exam  Constitutional: She appears well-developed and well-nourished.  HENT:  Right Ear: External ear normal.  Left Ear: External ear normal.  Nose: Nose normal.  Mouth/Throat: Oropharynx is clear and moist.  Eyes: Conjunctivae are normal.  Pulmonary/Chest: Effort normal and breath sounds normal.  Lymphadenopathy:    She has no cervical adenopathy.          Assessment & Plan:  Add Mucinex .

## 2014-06-23 ENCOUNTER — Ambulatory Visit: Payer: Managed Care, Other (non HMO) | Admitting: Dietician

## 2014-07-01 ENCOUNTER — Ambulatory Visit (INDEPENDENT_AMBULATORY_CARE_PROVIDER_SITE_OTHER): Payer: Managed Care, Other (non HMO) | Admitting: Internal Medicine

## 2014-07-01 ENCOUNTER — Encounter: Payer: Self-pay | Admitting: Internal Medicine

## 2014-07-01 VITALS — BP 132/86 | HR 96 | Temp 98.0°F | Ht 66.5 in | Wt 306.0 lb

## 2014-07-01 DIAGNOSIS — R748 Abnormal levels of other serum enzymes: Secondary | ICD-10-CM

## 2014-07-01 DIAGNOSIS — E039 Hypothyroidism, unspecified: Secondary | ICD-10-CM

## 2014-07-01 DIAGNOSIS — E282 Polycystic ovarian syndrome: Secondary | ICD-10-CM

## 2014-07-01 LAB — HEPATIC FUNCTION PANEL
ALT: 41 U/L — ABNORMAL HIGH (ref 0–35)
AST: 22 U/L (ref 0–37)
Albumin: 3.8 g/dL (ref 3.5–5.2)
Alkaline Phosphatase: 80 U/L (ref 39–117)
BILIRUBIN DIRECT: 0 mg/dL (ref 0.0–0.3)
TOTAL PROTEIN: 7.2 g/dL (ref 6.0–8.3)
Total Bilirubin: 0.7 mg/dL (ref 0.2–1.2)

## 2014-07-01 LAB — HEMOGLOBIN A1C: Hgb A1c MFr Bld: 5.7 % (ref 4.6–6.5)

## 2014-07-01 LAB — TSH: TSH: 3.47 u[IU]/mL (ref 0.35–4.50)

## 2014-07-01 NOTE — Progress Notes (Signed)
   Subjective:    Patient ID: Tracie ReichertLisa Johnston, female    DOB: 08/10/1980, 33 y.o.   MRN: [id number]  HPI  33 year old white female with history of morbid obesity, hypothyroidism and polycystic ovarian syndrome for routine follow-up. Interim medical history-patient reports establishing with gynecologist. She was referred to nutritionist. Patient also prescribed letrozol and medroxyprogesterone. She has not started her medication.  Patient unable to tolerate metformin secondary to gastrointestinal side effects.  Hypothyroidism - weight is stable.   Morbid obesity - She did not have a good visit with her nutritionist.   Review of Systems Increased stress - Her father recently hospitalized with multiple medical problems.    Past Medical History  Diagnosis Date  . Allergy   . Hypothyroid   . Anxiety   . PCOS (polycystic ovarian syndrome)   . Hyperlipidemia     History   Social History  . Marital Status: Married    Spouse Name: N/A    Number of Children: N/A  . Years of Education: N/A   Occupational History  . Not on file.   Social History Main Topics  . Smoking status: Never Smoker   . Smokeless tobacco: Never Used  . Alcohol Use: No  . Drug Use: No  . Sexual Activity: Not on file   Other Topics Concern  . Not on file   Social History Narrative    Past Surgical History  Procedure Laterality Date  . Shoulder surgery  2012    Repair of torn labrum  . Colonoscopy  2005    Normal. IBS    Family History  Problem Relation Age of Onset  . Kidney cancer Maternal Grandmother   . Hyperlipidemia Maternal Grandfather   . Heart disease Maternal Grandfather   . Skin cancer Maternal Grandfather   . Prostate cancer Maternal Grandfather   . Colon cancer Maternal Grandfather   . Diabetes Paternal Grandmother     Allergies  Allergen Reactions  . Penicillins Hives  . Sulfa Antibiotics Hives    Current Outpatient Prescriptions on File Prior to Visit  Medication Sig  Dispense Refill  . levothyroxine (SYNTHROID, LEVOTHROID) 150 MCG tablet Take 1 tablet (150 mcg total) by mouth daily. 90 tablet 1   No current facility-administered medications on file prior to visit.    BP 132/86 mmHg  Pulse 96  Temp(Src) 98 F (36.7 C) (Oral)  Ht 5' 6.5" (1.689 m)  Wt 306 lb (138.801 kg)  BMI 48.66 kg/m2    Objective:   Physical Exam  Constitutional: She is oriented to person, place, and time. She appears well-developed and well-nourished. No distress.  Cardiovascular: Normal rate and normal heart sounds.   No murmur heard. Pulmonary/Chest: Effort normal and breath sounds normal. She has no wheezes.  Abdominal:  Abdominal obesity  Neurological: She is alert and oriented to person, place, and time. No cranial nerve deficit.  Skin: Skin is warm and dry.  Psychiatric: She has a normal mood and affect. Her behavior is normal.          Assessment & Plan:

## 2014-07-01 NOTE — Assessment & Plan Note (Signed)
Monitor TFTs.  Adjust Levothyroxine dose accordingly.

## 2014-07-01 NOTE — Assessment & Plan Note (Addendum)
Patient was referred to gynecologist. GYN recommended weight loss. Patient unable to tolerate metformin due to gastrointestinal side effects.  Patient was prescribed letrozole and medroxyprogesterone.  Check A1c.

## 2014-07-01 NOTE — Progress Notes (Signed)
Pre visit review using our clinic review tool, if applicable. No additional management support is needed unless otherwise documented below in the visit note. 

## 2014-07-19 ENCOUNTER — Telehealth: Payer: Self-pay | Admitting: Internal Medicine

## 2014-07-19 MED ORDER — LEVOTHYROXINE SODIUM 175 MCG PO TABS: 175.0000 ug | ORAL_TABLET | Freq: Every day | ORAL | Status: DC

## 2014-07-19 NOTE — Telephone Encounter (Signed)
Pt was seen on 12/11 and per pt md change synthoid med. Please call into [facility]

## 2014-07-19 NOTE — Telephone Encounter (Signed)
Per Dr Olegario MessierYoo's result note increase to 175mcg, rx sent in electronically

## 2014-07-25 ENCOUNTER — Ambulatory Visit
Admission: RE | Admit: 2014-07-25 | Discharge: 2014-07-25 | Disposition: A | Discharge status: Home / Self Care | Payer: Managed Care, Other (non HMO) | Source: Ambulatory Visit | Attending: Internal Medicine | Admitting: Internal Medicine

## 2014-07-25 DIAGNOSIS — R7401 Elevation of levels of liver transaminase levels: Secondary | ICD-10-CM

## 2014-07-25 DIAGNOSIS — R740 Nonspecific elevation of levels of transaminase and lactic acid dehydrogenase [LDH]: Principal | ICD-10-CM

## 2014-09-06 ENCOUNTER — Other Ambulatory Visit: Payer: Managed Care, Other (non HMO)

## 2014-09-09 ENCOUNTER — Other Ambulatory Visit (INDEPENDENT_AMBULATORY_CARE_PROVIDER_SITE_OTHER): Payer: Managed Care, Other (non HMO)

## 2014-09-09 ENCOUNTER — Encounter: Payer: Self-pay | Admitting: Family Medicine

## 2014-09-09 ENCOUNTER — Ambulatory Visit (INDEPENDENT_AMBULATORY_CARE_PROVIDER_SITE_OTHER): Payer: Managed Care, Other (non HMO) | Admitting: Family Medicine

## 2014-09-09 VITALS — BP 116/70 | HR 75 | Temp 98.0°F | Ht 66.5 in | Wt 305.2 lb

## 2014-09-09 DIAGNOSIS — R21 Rash and other nonspecific skin eruption: Secondary | ICD-10-CM

## 2014-09-09 DIAGNOSIS — B169 Acute hepatitis B without delta-agent and without hepatic coma: Secondary | ICD-10-CM

## 2014-09-09 DIAGNOSIS — B1910 Unspecified viral hepatitis B without hepatic coma: Secondary | ICD-10-CM

## 2014-09-09 LAB — HEPATIC FUNCTION PANEL
ALBUMIN: 3.8 g/dL (ref 3.5–5.2)
ALT: 42 U/L — ABNORMAL HIGH (ref 0–35)
AST: 23 U/L (ref 0–37)
Alkaline Phosphatase: 84 U/L (ref 39–117)
Bilirubin, Direct: 0.1 mg/dL (ref 0.0–0.3)
Total Bilirubin: 0.6 mg/dL (ref 0.2–1.2)
Total Protein: 7.2 g/dL (ref 6.0–8.3)

## 2014-09-09 NOTE — Patient Instructions (Signed)
-  antifungal 1 time daily for 1-3 weeks until resolved  -hydrocortisone over the counter cream daily for 1 week to spot on L arm  -follow up if worsens or does not resolve

## 2014-09-09 NOTE — Progress Notes (Signed)
  HPI:  Acute visit for: -rash under R arm -itchy red rash under R arm -she put antifungal on and it helped, but not resoved -reports seen before for this and told was yeast and told to use OTC cream when occurs -report hx surgery  -denies: fevers, rash elsewhere other then small erythematous scale on L arm, SOB  ROS: See pertinent positives and negatives per HPI.  Past Medical History  Diagnosis Date  . Allergy   . Hypothyroid   . Anxiety   . PCOS (polycystic ovarian syndrome)   . Hyperlipidemia     Past Surgical History  Procedure Laterality Date  . Shoulder surgery  2012    Repair of torn labrum  . Colonoscopy  2005    Normal. IBS    Family History  Problem Relation Age of Onset  . Kidney cancer Maternal Grandmother   . Hyperlipidemia Maternal Grandfather   . Heart disease Maternal Grandfather   . Skin cancer Maternal Grandfather   . Prostate cancer Maternal Grandfather   . Colon cancer Maternal Grandfather   . Diabetes Paternal Grandmother     History   Social History  . Marital Status: Married    Spouse Name: N/A  . Number of Children: N/A  . Years of Education: N/A   Social History Main Topics  . Smoking status: Never Smoker   . Smokeless tobacco: Never Used  . Alcohol Use: No  . Drug Use: No  . Sexual Activity: Not on file   Other Topics Concern  . None   Social History Narrative     Current outpatient prescriptions:  .  levothyroxine (SYNTHROID, LEVOTHROID) 175 MCG tablet, Take 1 tablet (175 mcg total) by mouth daily before breakfast., Disp: 90 tablet, Rfl: 0  EXAM:  Filed Vitals:   09/09/14 1128  BP: 116/70  Pulse: 75  Temp: 98 F (36.7 C)    Body mass index is 48.53 kg/(m^2).  GENERAL: vitals reviewed and listed above, alert, oriented, appears well hydrated and in no acute distress  HEENT: atraumatic, conjunttiva clear, no obvious abnormalities on inspection of external nose and ears   NECK: no obvious masses on  inspection  SKIN: mild very faint hyperpig of skin R axillary; very small 4mm scaly erythematous patch of skin L arm  MS: moves all extremities without noticeable abnormality  PSYCH: pleasant and cooperative, no obvious depression or anxiety  ASSESSMENT AND PLAN:  Discussed the following assessment and plan:  Rash and nonspecific skin eruption  -Patient advised to return or notify a doctor immediately if symptoms worsen or persist or new concerns arise.  Patient Instructions  -antifungal 1 time daily for 1-3 weeks until resolved  -hydrocortisone over the counter cream daily for 1 week to spot on L arm  -follow up if worsens or does not resolve     KIM, HANNAH R.

## 2014-09-09 NOTE — Progress Notes (Signed)
Pre visit review using our clinic review tool, if applicable. No additional management support is needed unless otherwise documented below in the visit note. 

## 2014-09-12 LAB — ACUTE HEP PANEL AND HEP B SURFACE AB
HCV AB: NEGATIVE
Hep A IgM: NONREACTIVE
Hep B C IgM: NONREACTIVE
Hep B S Ab: POSITIVE — AB
Hepatitis B Surface Ag: NEGATIVE

## 2014-09-13 ENCOUNTER — Other Ambulatory Visit: Payer: Self-pay | Admitting: Internal Medicine

## 2014-09-13 DIAGNOSIS — R945 Abnormal results of liver function studies: Principal | ICD-10-CM

## 2014-09-13 DIAGNOSIS — R7989 Other specified abnormal findings of blood chemistry: Secondary | ICD-10-CM

## 2014-09-27 ENCOUNTER — Other Ambulatory Visit: Payer: Self-pay | Admitting: Internal Medicine

## 2014-09-27 MED ORDER — LEVOTHYROXINE SODIUM 175 MCG PO TABS: 175.0000 ug | ORAL_TABLET | Freq: Every day | ORAL | Status: DC

## 2014-09-28 ENCOUNTER — Ambulatory Visit (INDEPENDENT_AMBULATORY_CARE_PROVIDER_SITE_OTHER): Payer: Managed Care, Other (non HMO) | Admitting: Internal Medicine

## 2014-09-28 ENCOUNTER — Encounter: Payer: Self-pay | Admitting: Internal Medicine

## 2014-09-28 VITALS — BP 126/84 | HR 86 | Temp 98.2°F | Ht 66.5 in | Wt 313.0 lb

## 2014-09-28 DIAGNOSIS — Z0000 Encounter for general adult medical examination without abnormal findings: Secondary | ICD-10-CM

## 2014-09-28 DIAGNOSIS — Z23 Encounter for immunization: Secondary | ICD-10-CM

## 2014-09-28 DIAGNOSIS — Z111 Encounter for screening for respiratory tuberculosis: Secondary | ICD-10-CM

## 2014-09-28 NOTE — Patient Instructions (Signed)
Please complete the following lab tests before your next follow up appointment: TSH - hypothyroidism

## 2014-09-28 NOTE — Assessment & Plan Note (Signed)
Reviewed adult health maintenance protocols.  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 Patient does not have any history of tuberculosis exposure. PPD placed today. Patient updated with tetanus vaccine.  Patient understands importance of weight loss.  She is concerned about fatty liver.  We will continue to monitor.

## 2014-09-28 NOTE — Progress Notes (Signed)
Subjective:    Patient ID: Tracie ReichertLisa Johnston, female    DOB: Jan 18, 1981, 34 y.o.   MRN: [id number]  HPI  34 year old white female with history of PCOS, obesity and LFT elevation presents for routine physical. 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  Patient denies any history of tuberculosis exposure. She has received hepatitis B vaccine in the past. It has been little over 8 years since her last tetanus vaccine.   Review of Systems  Constitutional: Negative for activity change, appetite change and unexpected weight change.  Eyes: Negative for visual disturbance.  Respiratory: Negative for cough, chest tightness and shortness of breath.   Cardiovascular: Negative for chest pain.  Genitourinary: Negative for difficulty urinating.  Neurological: Negative for headaches.  Gastrointestinal: Negative for abdominal pain, heartburn melena or hematochezia Psych: Negative for depression or anxiety         Past Medical History  Diagnosis Date  . Allergy   . Hypothyroid   . Anxiety   . PCOS (polycystic ovarian syndrome)   . Hyperlipidemia     History   Social History  . Marital Status: Married    Spouse Name: N/A  . Number of Children: N/A  . Years of Education: N/A   Occupational History  . Not on file.   Social History Main Topics  . Smoking status: Never Smoker   . Smokeless tobacco: Never Used  . Alcohol Use: No  . Drug Use: No  . Sexual Activity: Not on file   Other Topics Concern  . Not on file   Social History Narrative    Past Surgical History  Procedure Laterality Date  . Shoulder surgery  2012    Repair of torn labrum  . Colonoscopy  2005    Normal. IBS    Family History  Problem Relation Age of Onset  . Kidney cancer Maternal Grandmother   . Hyperlipidemia Maternal Grandfather   . Heart disease Maternal Grandfather   . Skin cancer Maternal Grandfather   . Prostate cancer Maternal Grandfather   . Colon cancer  Maternal Grandfather   . Diabetes Paternal Grandmother     Allergies  Allergen Reactions  . Penicillins Hives  . Sulfa Antibiotics Hives    Current Outpatient Prescriptions on File Prior to Visit  Medication Sig Dispense Refill  . levothyroxine (SYNTHROID, LEVOTHROID) 175 MCG tablet Take 1 tablet (175 mcg total) by mouth daily before breakfast. 90 tablet 2   No current facility-administered medications on file prior to visit.    BP 126/84 mmHg  Pulse 86  Temp(Src) 98.2 F (36.8 C) (Oral)  Ht 5' 6.5" (1.689 m)  Wt 313 lb (141.976 kg)  BMI 49.77 kg/m2    Objective:   Physical Exam  Constitutional: She is oriented to person, place, and time. She appears well-developed and well-nourished. No distress.  HENT:  Head: Normocephalic and atraumatic.  Right Ear: External ear normal.  Left Ear: External ear normal.  Mouth/Throat: Oropharynx is clear and moist.  Eyes: EOM are normal. Pupils are equal, round, and reactive to light.  Neck: Neck supple.  Cardiovascular: Normal rate, regular rhythm and normal heart sounds.   No murmur heard. Pulmonary/Chest: Effort normal and breath sounds normal. She has no wheezes.  Musculoskeletal: She exhibits no edema.  Lymphadenopathy:    She has no cervical adenopathy.  Neurological: She is alert and oriented to person, place, and time. No cranial nerve deficit.  Skin: Skin is warm and dry.  Psychiatric: She has a normal mood and affect. Her behavior is normal.          Assessment & Plan:

## 2014-09-30 ENCOUNTER — Ambulatory Visit: Payer: Managed Care, Other (non HMO) | Admitting: Internal Medicine

## 2015-01-26 ENCOUNTER — Inpatient Hospital Stay (HOSPITAL_COMMUNITY): Payer: Managed Care, Other (non HMO)

## 2015-01-26 ENCOUNTER — Encounter (HOSPITAL_COMMUNITY): Payer: Self-pay | Admitting: *Deleted

## 2015-01-26 ENCOUNTER — Inpatient Hospital Stay (HOSPITAL_COMMUNITY)
Admission: AD | Admit: 2015-01-26 | Discharge: 2015-01-26 | Disposition: A | Discharge status: Home / Self Care | Payer: Managed Care, Other (non HMO) | Source: Ambulatory Visit | Attending: Obstetrics & Gynecology | Admitting: Obstetrics & Gynecology

## 2015-01-26 DIAGNOSIS — N979 Female infertility, unspecified: Secondary | ICD-10-CM | POA: Diagnosis not present

## 2015-01-26 DIAGNOSIS — N939 Abnormal uterine and vaginal bleeding, unspecified: Secondary | ICD-10-CM | POA: Diagnosis present

## 2015-01-26 DIAGNOSIS — R938 Abnormal findings on diagnostic imaging of other specified body structures: Secondary | ICD-10-CM | POA: Insufficient documentation

## 2015-01-26 DIAGNOSIS — R109 Unspecified abdominal pain: Secondary | ICD-10-CM

## 2015-01-26 LAB — CBC
HEMATOCRIT: 42.5 % (ref 36.0–46.0)
HEMOGLOBIN: 14.7 g/dL (ref 12.0–15.0)
MCH: 29 pg (ref 26.0–34.0)
MCHC: 34.6 g/dL (ref 30.0–36.0)
MCV: 83.8 fL (ref 78.0–100.0)
Platelets: 355 10*3/uL (ref 150–400)
RBC: 5.07 MIL/uL (ref 3.87–5.11)
RDW: 13.6 % (ref 11.5–15.5)
WBC: 10.3 10*3/uL (ref 4.0–10.5)

## 2015-01-26 LAB — ABO/RH: ABO/RH(D): A POS

## 2015-01-26 LAB — HCG, QUANTITATIVE, PREGNANCY: hCG, Beta Chain, Quant, S: <1 m[IU]/mL (ref <5)

## 2015-01-26 MED ORDER — KETOROLAC TROMETHAMINE 60 MG/2ML IM SOLN
60.0000 mg | Freq: Once | INTRAMUSCULAR | Status: AC
  Administered 2015-01-26: 60 mg via INTRAMUSCULAR
  Filled 2015-01-26: qty 2

## 2015-01-26 MED ORDER — ONDANSETRON 8 MG PO TBDP
8.0000 mg | ORAL_TABLET | Freq: Once | ORAL | Status: AC
  Administered 2015-01-26: 8 mg via ORAL
  Filled 2015-01-26: qty 1

## 2015-01-26 NOTE — Plan of Care (Signed)
Pt. Urine sent to lab  

## 2015-01-26 NOTE — MAU Provider Note (Signed)
History     CSN: [id number]  Arrival date and time: 01/26/15 1236   First Provider Initiated Contact with Patient 01/26/15 1311      Chief Complaint  Patient presents with  . Vaginal Bleeding   HPI  Pt is 34 yo G0P0 with hx of POS and infertility.  Pt presents with heavy bleeding passing a large clot yesterday morning and has been passing smaller clots since that time.  Pt states that she has changed a tampon 2 to 3 times and hour.   Pt took some Aleve yesterday without relief of cramping.   Pt has a hx of amenorrhea with yearly menses until started on medication to induce menses Pt is feeling light headed- denies constipation, diarrhea, N/V/UTI sx Pt concerned that she may have been pregnant and had a miscarriage since she was trying to get pregnant  Past Medical History  Diagnosis Date  . Allergy   . Hypothyroid   . Anxiety   . PCOS (polycystic ovarian syndrome)   . Hyperlipidemia     Past Surgical History  Procedure Laterality Date  . Shoulder surgery  2012    Repair of torn labrum  . Colonoscopy  2005    Normal. IBS    Family History  Problem Relation Age of Onset  . Kidney cancer Maternal Grandmother   . Hyperlipidemia Maternal Grandfather   . Heart disease Maternal Grandfather   . Skin cancer Maternal Grandfather   . Prostate cancer Maternal Grandfather   . Colon cancer Maternal Grandfather   . Diabetes Paternal Grandmother     History  Substance Use Topics  . Smoking status: Never Smoker   . Smokeless tobacco: Never Used  . Alcohol Use: No    Allergies:  Allergies  Allergen Reactions  . Penicillins Hives  . Sulfa Antibiotics Hives    Prescriptions prior to admission  Medication Sig Dispense Refill Last Dose  . levothyroxine (SYNTHROID, LEVOTHROID) 175 MCG tablet Take 1 tablet (175 mcg total) by mouth daily before breakfast. 90 tablet 2 Taking    Review of Systems  Constitutional: Negative for fever and chills.  Gastrointestinal: Positive  for abdominal pain. Negative for nausea, vomiting, diarrhea and constipation.  Genitourinary: Negative for dysuria and urgency.   Physical Exam   Blood pressure 138/79, pulse 81, temperature 97.8 F (36.6 C), temperature source Oral, resp. rate 18.  Physical Exam  Nursing note and vitals reviewed. Constitutional: She is oriented to person, place, and time. She appears well-developed and well-nourished. No distress.  HENT:  Head: Normocephalic.  Eyes: Pupils are equal, round, and reactive to light.  Neck: Normal range of motion. Neck supple.  Cardiovascular: Normal rate.   Respiratory: Effort normal.  GI: Soft. There is tenderness.  Musculoskeletal: Normal range of motion.  Neurological: She is alert and oriented to person, place, and time.  Skin: Skin is warm and dry.  Psychiatric: She has a normal mood and affect.    MAU Course  Procedures Pt had several episodes of sharp shooting pain RLQ bringing pt to tears. Pt has not had this type of pain before- gave Toradol 60mg  IM and zofran 8mg  ODT which relieved pain Discussed with Dr. Langston MaskerMorris- will do ultrasound Results for orders placed or performed during the hospital encounter of 01/26/15 (from the past 24 hour(s))  CBC     Status: None   Collection Time: 01/26/15  1:34 PM  Result Value Ref Range   WBC 10.3 4.0 - 10.5 K/uL   RBC 5.07  3.87 - 5.11 MIL/uL   Hemoglobin 14.7 12.0 - 15.0 g/dL   HCT 16.1 09.6 - 04.5 %   MCV 83.8 78.0 - 100.0 fL   MCH 29.0 26.0 - 34.0 pg   MCHC 34.6 30.0 - 36.0 g/dL   RDW 40.9 81.1 - 91.4 %   Platelets 355 150 - 400 K/uL  hCG, quantitative, pregnancy     Status: None   Collection Time: 01/26/15  1:34 PM  Result Value Ref Range   hCG, Beta Chain, Quant, S <1 <5 mIU/mL  ABO/Rh     Status: None (Preliminary result)   Collection Time: 01/26/15  1:34 PM  Result Value Ref Range   ABO/RH(D) A POS   US Transvaginal Non-ob  01/26/2015   CLINICAL DATA:  Patient with heavy vaginal bleeding and clots.  Right lower quadrant pain and cramping for 2 days.  EXAM: TRANSABDOMINAL AND TRANSVAGINAL ULTRASOUND OF PELVIS  TECHNIQUE: Both transabdominal and transvaginal ultrasound examinations of the pelvis were performed. Transabdominal technique was performed for global imaging of the pelvis including uterus, ovaries, adnexal regions, and pelvic cul-de-sac. It was necessary to proceed with endovaginal exam following the transabdominal exam to visualize the endometrium.  COMPARISON:  Abdominal ultrasound 07/25/2014  FINDINGS: Uterus  Measurements: 8.0 x 3.6 x 4.4 cm. No fibroids or other mass visualized.  Endometrium  There is focal masslike thickening of the endometrium measuring up to 13 mm. There appears to be a small amount of associated cystic change.  Right ovary  Measurements: 2.2 x 2.5 x 2.4 cm. Normal appearance/no adnexal mass.  Left ovary  Not visualized.  Other findings  No free fluid.  IMPRESSION: Masslike thickening of the endometrium measuring up to 13 mm. Findings raise the possibility of endometrial mass such as polyp, endometrial hyperplasia or endometrial carcinoma. Consider further evaluation with sonohysterogram for confirmation prior to hysteroscopy. Endometrial sampling should also be considered if patient is at high risk for endometrial carcinoma. (Ref: Radiological Reasoning: Algorithmic Workup of Abnormal Vaginal Bleeding with Endovaginal Sonography and Sonohysterography. AJR 2008; 782:N56-21)  These results will be called to the ordering clinician or representative by the Radiologist Assistant, and communication documented in the PACS or zVision Dashboard.   Electronically Signed   By: Annia Belt M.D.   On: 01/26/2015 16:50   US Pelvis Complete  01/26/2015   CLINICAL DATA:  Patient with heavy vaginal bleeding and clots. Right lower quadrant pain and cramping for 2 days.  EXAM: TRANSABDOMINAL AND TRANSVAGINAL ULTRASOUND OF PELVIS  TECHNIQUE: Both transabdominal and transvaginal ultrasound  examinations of the pelvis were performed. Transabdominal technique was performed for global imaging of the pelvis including uterus, ovaries, adnexal regions, and pelvic cul-de-sac. It was necessary to proceed with endovaginal exam following the transabdominal exam to visualize the endometrium.  COMPARISON:  Abdominal ultrasound 07/25/2014  FINDINGS: Uterus  Measurements: 8.0 x 3.6 x 4.4 cm. No fibroids or other mass visualized.  Endometrium  There is focal masslike thickening of the endometrium measuring up to 13 mm. There appears to be a small amount of associated cystic change.  Right ovary  Measurements: 2.2 x 2.5 x 2.4 cm. Normal appearance/no adnexal mass.  Left ovary  Not visualized.  Other findings  No free fluid.  IMPRESSION: Masslike thickening of the endometrium measuring up to 13 mm. Findings raise the possibility of endometrial mass such as polyp, endometrial hyperplasia or endometrial carcinoma. Consider further evaluation with sonohysterogram for confirmation prior to hysteroscopy. Endometrial sampling should also be considered if patient  is at high risk for endometrial carcinoma. (Ref: Radiological Reasoning: Algorithmic Workup of Abnormal Vaginal Bleeding with Endovaginal Sonography and Sonohysterography. AJR 2008; 161:W96-04)  These results will be called to the ordering clinician or representative by the Radiologist Assistant, and communication documented in the PACS or zVision Dashboard.   Electronically Signed   By: Annia Belt M.D.   On: 01/26/2015 16:50  discussed with Dr. Langston Masker Assessment and Plan  AUB infertility Focal masslike thickening of endometrium F/u in office for sonohysterogram  Siniyah Evangelist 01/26/2015, 1:23 PM

## 2015-01-26 NOTE — MAU Note (Signed)
Pt has been bleeding for two days with large clots.  Pt has also had a lot of cramping that started at the same time.  States that she is using 3 super plus tampons per hour.

## 2015-03-09 HISTORY — PX: DILATION AND CURETTAGE OF UTERUS: SHX78

## 2015-03-20 ENCOUNTER — Ambulatory Visit (INDEPENDENT_AMBULATORY_CARE_PROVIDER_SITE_OTHER): Payer: Managed Care, Other (non HMO) | Admitting: Family Medicine

## 2015-03-20 ENCOUNTER — Encounter: Payer: Self-pay | Admitting: Family Medicine

## 2015-03-20 VITALS — BP 120/90 | HR 97 | Temp 98.0°F | Ht 66.5 in | Wt 318.7 lb

## 2015-03-20 DIAGNOSIS — J0100 Acute maxillary sinusitis, unspecified: Secondary | ICD-10-CM

## 2015-03-20 MED ORDER — BENZONATATE 100 MG PO CAPS: 100.0000 mg | ORAL_CAPSULE | Freq: Two times a day (BID) | ORAL | Status: DC | PRN

## 2015-03-20 MED ORDER — DOXYCYCLINE HYCLATE 100 MG PO CAPS: 100.0000 mg | ORAL_CAPSULE | Freq: Two times a day (BID) | ORAL | Status: DC

## 2015-03-20 NOTE — Progress Notes (Signed)
HPI:  Tracie Johnston is a 34 yo F patient of Dr. Artist Pais with a PMH significant for Allergies and anxiety here for an acute visit for:  URI: -started: almost 2 weeks ago, but now worsening -symptoms:nasal congestion, sore throat, cough - now with subjective fever, sinus pain R maxillary and mild SOB yesterday -denies:fever,NVD, tooth pain -has tried: lots of OTC medications -sick contacts/travel/risks: denies flu exposure, tick exposure or or Ebola risks -husband got a cold before her -Hx of: allergies, repots always gets a sinus infection when gest sick ROS: See pertinent positives and negatives per HPI.  Past Medical History  Diagnosis Date  . Allergy   . Hypothyroid   . Anxiety   . PCOS (polycystic ovarian syndrome)   . Hyperlipidemia     Past Surgical History  Procedure Laterality Date  . Shoulder surgery  2012    Repair of torn labrum  . Colonoscopy  2005    Normal. IBS  . Dilation and curettage of uterus  03/09/2015    Dr Eulogio Ditch to uterine polyp    Family History  Problem Relation Age of Onset  . Kidney cancer Maternal Grandmother   . Hyperlipidemia Maternal Grandfather   . Heart disease Maternal Grandfather   . Skin cancer Maternal Grandfather   . Prostate cancer Maternal Grandfather   . Colon cancer Maternal Grandfather   . Diabetes Paternal Grandmother     Social History   Social History  . Marital Status: Married    Spouse Name: N/A  . Number of Children: N/A  . Years of Education: N/A   Social History Main Topics  . Smoking status: Never Smoker   . Smokeless tobacco: Never Used  . Alcohol Use: No  . Drug Use: No  . Sexual Activity: Yes    Birth Control/ Protection: None   Other Topics Concern  . None   Social History Narrative     Current outpatient prescriptions:  .  dextromethorphan-guaiFENesin (MUCINEX DM) 30-600 MG per 12 hr tablet, Take 1 tablet by mouth 2 (two) times daily., Disp: , Rfl:  .  fluticasone (FLONASE) 50 MCG/ACT  nasal spray, Place into both nostrils daily., Disp: , Rfl:  .  levothyroxine (SYNTHROID, LEVOTHROID) 175 MCG tablet, Take 1 tablet (175 mcg total) by mouth daily before breakfast., Disp: 90 tablet, Rfl: 2 .  loratadine-pseudoephedrine (CLARITIN-D 24-HOUR) 10-240 MG per 24 hr tablet, Take 1 tablet by mouth daily., Disp: , Rfl:  .  benzonatate (TESSALON) 100 MG capsule, Take 1 capsule (100 mg total) by mouth 2 (two) times daily as needed for cough., Disp: 20 capsule, Rfl: 0 .  doxycycline (VIBRAMYCIN) 100 MG capsule, Take 1 capsule (100 mg total) by mouth 2 (two) times daily., Disp: 20 capsule, Rfl: 0  EXAM:  Filed Vitals:   03/20/15 1126  BP: 120/90  Pulse: 97  Temp: 98 F (36.7 C)    Body mass index is 50.67 kg/(m^2).  GENERAL: vitals reviewed and listed above, alert, oriented, appears well hydrated and in no acute distress  HEENT: atraumatic, conjunttiva clear, no obvious abnormalities on inspection of external nose and ears, normal appearance of ear canals and TMs, thick nasal congestion, mild post oropharyngeal erythema with PND, no tonsillar edema or exudate, R max sinus TTP  NECK: no obvious masses on inspection  LUNGS: clear to auscultation bilaterally, no wheezes, rales or rhonchi, good air movement  CV: HRRR, no peripheral edema  MS: moves all extremities without noticeable abnormality  PSYCH: pleasant and  cooperative, no obvious depression or anxiety  ASSESSMENT AND PLAN:  Discussed the following assessment and plan:  Acute maxillary sinusitis, recurrence not specified  -likely sinusitis, cover with abx - denies any chance of pregnancy and risks of abx discussed -return and follow up precuations -of course, we advised to return or notify a doctor immediately if symptoms worsen or persist or new concerns arise.    There are no Patient Instructions on file for this visit.   Kriste Basque R.

## 2015-03-20 NOTE — Progress Notes (Signed)
Pre visit review using our clinic review tool, if applicable. No additional management support is needed unless otherwise documented below in the visit note. 

## 2015-03-20 NOTE — Patient Instructions (Signed)
-  As we discussed, we have prescribed a new medication for you at this appointment. We discussed the common and serious potential adverse effects of this medication and you can review these and more with the pharmacist when you pick up your medication.  Please follow the instructions for use carefully and notify us immediately if you have any problems taking this medication.  Follow up as needed  

## 2015-04-24 ENCOUNTER — Encounter: Payer: Self-pay | Admitting: Family Medicine

## 2015-04-24 ENCOUNTER — Ambulatory Visit: Payer: Managed Care, Other (non HMO) | Admitting: Internal Medicine

## 2015-04-24 ENCOUNTER — Ambulatory Visit (INDEPENDENT_AMBULATORY_CARE_PROVIDER_SITE_OTHER): Payer: Managed Care, Other (non HMO) | Admitting: Family Medicine

## 2015-04-24 VITALS — BP 140/92 | HR 103 | Temp 98.3°F | Ht 66.5 in | Wt 327.3 lb

## 2015-04-24 DIAGNOSIS — R030 Elevated blood-pressure reading, without diagnosis of hypertension: Secondary | ICD-10-CM | POA: Diagnosis not present

## 2015-04-24 DIAGNOSIS — IMO0001 Reserved for inherently not codable concepts without codable children: Secondary | ICD-10-CM

## 2015-04-24 DIAGNOSIS — Z7185 Encounter for immunization safety counseling: Secondary | ICD-10-CM

## 2015-04-24 DIAGNOSIS — Z7189 Other specified counseling: Secondary | ICD-10-CM | POA: Diagnosis not present

## 2015-04-24 DIAGNOSIS — E039 Hypothyroidism, unspecified: Secondary | ICD-10-CM | POA: Diagnosis not present

## 2015-04-24 LAB — TSH: TSH: 2.13 u[IU]/mL (ref 0.35–4.50)

## 2015-04-24 NOTE — Progress Notes (Signed)
HPI:  Kiah Vanalstine is a 34 yo F patient of Dr. Shawna Orleans here for an acute visit for:  "Thyroid Check" -hx hypothyroidism  -meds: levothyroxine 180mg -reports: takes medicines most day, on empty stomach -denies: weight changes, constipation, palpitations  Vaccine management: -wants titer for job applications: MMR and hep b reports lost vaccines  Elevated Blood pressure: -on check today, reports borderline in the past but reports no exercise and extra stress this month -doing counseling and plans to start exercising and wants to try this first before considering medications -FH of HTN -denies CP, SOB, DOE   ROS: See pertinent positives and negatives per HPI.  Past Medical History  Diagnosis Date  . Allergy   . Hypothyroid   . Anxiety   . PCOS (polycystic ovarian syndrome)   . Hyperlipidemia     Past Surgical History  Procedure Laterality Date  . Shoulder surgery  2012    Repair of torn labrum  . Colonoscopy  2005    Normal. IBS  . Dilation and curettage of uterus  03/09/2015    Dr MAnnice Pihto uterine polyp    Family History  Problem Relation Age of Onset  . Kidney cancer Maternal Grandmother   . Hyperlipidemia Maternal Grandfather   . Heart disease Maternal Grandfather   . Skin cancer Maternal Grandfather   . Prostate cancer Maternal Grandfather   . Colon cancer Maternal Grandfather   . Diabetes Paternal Grandmother     Social History   Social History  . Marital Status: Married    Spouse Name: N/A  . Number of Children: N/A  . Years of Education: N/A   Social History Main Topics  . Smoking status: Never Smoker   . Smokeless tobacco: Never Used  . Alcohol Use: No  . Drug Use: No  . Sexual Activity: Yes    Birth Control/ Protection: None   Other Topics Concern  . None   Social History Narrative     Current outpatient prescriptions:  .  benzonatate (TESSALON) 100 MG capsule, Take 1 capsule (100 mg total) by mouth 2 (two) times daily as needed  for cough., Disp: 20 capsule, Rfl: 0 .  dextromethorphan-guaiFENesin (MUCINEX DM) 30-600 MG per 12 hr tablet, Take 1 tablet by mouth 2 (two) times daily., Disp: , Rfl:  .  doxycycline (VIBRAMYCIN) 100 MG capsule, Take 1 capsule (100 mg total) by mouth 2 (two) times daily., Disp: 20 capsule, Rfl: 0 .  fluticasone (FLONASE) 50 MCG/ACT nasal spray, Place into both nostrils daily., Disp: , Rfl:  .  levothyroxine (SYNTHROID, LEVOTHROID) 175 MCG tablet, Take 1 tablet (175 mcg total) by mouth daily before breakfast., Disp: 90 tablet, Rfl: 2 .  loratadine-pseudoephedrine (CLARITIN-D 24-HOUR) 10-240 MG per 24 hr tablet, Take 1 tablet by mouth daily., Disp: , Rfl:   EXAM:  Filed Vitals:   04/24/15 1538  BP: 140/92  Pulse:   Temp:     Body mass index is 52.04 kg/(m^2).  GENERAL: vitals reviewed and listed above, alert, oriented, appears well hydrated and in no acute distress  HEENT: atraumatic, conjunttiva clear, no obvious abnormalities on inspection of external nose and ears  NECK: no obvious masses on inspection  LUNGS: clear to auscultation bilaterally, no wheezes, rales or rhonchi, good air movement  CV: HRRR, no peripheral edema  MS: moves all extremities without noticeable abnormality  PSYCH: pleasant and cooperative, no obvious depression or anxiety  ASSESSMENT AND PLAN:  Discussed the following assessment and plan:  Hypothyroidism, unspecified  hypothyroidism type - Plan: TSH  Morbid obesity, unspecified obesity type ([place])  Vaccine counseling - Plan: Measles/Mumps/Rubella Immunity  Elevated blood pressure  -lifestyle changes advised and opted to recheck BP in 2-3 months and advised adding medication if remains high -she asks about when Dr. Shawna Orleans will be back and asks about other options, she has decided to schedule transfer visit with [facility] -labs today per her request - had hep b labs in the past and tdap (these were printed and provided to her today) -Patient advised to  return or notify a doctor immediately if symptoms worsen or persist or new concerns arise.  Patient Instructions  BEFORE YOU LEAVE: -recheck blood pressure -schedule new patient visit with Tommi Rumps in 4 months -labs  We recommend the following healthy lifestyle measures: - eat a healthy whole foods diet consisting of regular small meals composed of vegetables, fruits, beans, nuts, seeds, healthy meats such as white chicken and fish and whole grains.  - avoid sweets, white starchy foods, fried foods, fast food, processed foods, sodas, red meet and other fattening foods.  - get a least 150-300 minutes of aerobic exercise per week.   -We have ordered labs or studies at this visit. It can take up to 1-2 weeks for results and processing. We will contact you with instructions IF your results are abnormal. Normal results will be released to your [facility]. If you have not heard from [place] or can not find your results in [facility] in 2 weeks please contact our office.            Colin Benton R.

## 2015-04-24 NOTE — Patient Instructions (Signed)
BEFORE YOU LEAVE: -recheck blood pressure -schedule new patient visit with Kandee Keen in 4 months -labs  We recommend the following healthy lifestyle measures: - eat a healthy whole foods diet consisting of regular small meals composed of vegetables, fruits, beans, nuts, seeds, healthy meats such as white chicken and fish and whole grains.  - avoid sweets, white starchy foods, fried foods, fast food, processed foods, sodas, red meet and other fattening foods.  - get a least 150-300 minutes of aerobic exercise per week.   -We have ordered labs or studies at this visit. It can take up to 1-2 weeks for results and processing. We will contact you with instructions IF your results are abnormal. Normal results will be released to your [facility]. If you have not heard from [place] or can not find your results in [facility] in 2 weeks please contact our office.

## 2015-04-25 LAB — MEASLES/MUMPS/RUBELLA IMMUNITY
Mumps IgG: <5 AU/mL (ref <9.00)
Rubella: 4.43 Index — ABNORMAL HIGH (ref <0.90)

## 2015-06-22 ENCOUNTER — Encounter: Payer: Self-pay | Admitting: Adult Health

## 2015-06-22 ENCOUNTER — Ambulatory Visit (INDEPENDENT_AMBULATORY_CARE_PROVIDER_SITE_OTHER): Payer: Managed Care, Other (non HMO) | Admitting: Adult Health

## 2015-06-22 VITALS — BP 132/92 | Temp 98.4°F | Ht 66.5 in | Wt 330.8 lb

## 2015-06-22 DIAGNOSIS — Z0000 Encounter for general adult medical examination without abnormal findings: Secondary | ICD-10-CM | POA: Diagnosis not present

## 2015-06-22 DIAGNOSIS — E039 Hypothyroidism, unspecified: Secondary | ICD-10-CM

## 2015-06-22 LAB — CBC WITH DIFFERENTIAL/PLATELET
BASOS ABS: 0.1 10*3/uL (ref 0.0–0.1)
Basophils Relative: 0.7 % (ref 0.0–3.0)
EOS ABS: 0.6 10*3/uL (ref 0.0–0.7)
EOS PCT: 5.4 % — AB (ref 0.0–5.0)
HCT: 43.3 % (ref 36.0–46.0)
HEMOGLOBIN: 14.1 g/dL (ref 12.0–15.0)
LYMPHS ABS: 2.5 10*3/uL (ref 0.7–4.0)
Lymphocytes Relative: 23.2 % (ref 12.0–46.0)
MCHC: 32.6 g/dL (ref 30.0–36.0)
MCV: 79.9 fl (ref 78.0–100.0)
MONO ABS: 0.9 10*3/uL (ref 0.1–1.0)
Monocytes Relative: 8.1 % (ref 3.0–12.0)
Neutro Abs: 6.8 10*3/uL (ref 1.4–7.7)
Neutrophils Relative %: 62.6 % (ref 43.0–77.0)
Platelets: 400 10*3/uL (ref 150.0–400.0)
RBC: 5.42 Mil/uL — AB (ref 3.87–5.11)
RDW: 15.8 % — ABNORMAL HIGH (ref 11.5–15.5)
WBC: 10.9 10*3/uL — AB (ref 4.0–10.5)

## 2015-06-22 LAB — BASIC METABOLIC PANEL
BUN: 11 mg/dL (ref 6–23)
CO2: 28 mEq/L (ref 19–32)
CREATININE: 0.65 mg/dL (ref 0.40–1.20)
Calcium: 9.4 mg/dL (ref 8.4–10.5)
Chloride: 103 mEq/L (ref 96–112)
GFR: 111.43 mL/min (ref >60.00)
GLUCOSE: 101 mg/dL — AB (ref 70–99)
POTASSIUM: 4.3 meq/L (ref 3.5–5.1)
Sodium: 139 mEq/L (ref 135–145)

## 2015-06-22 LAB — HEMOGLOBIN A1C: Hgb A1c MFr Bld: 6 % (ref 4.6–6.5)

## 2015-06-22 LAB — HEPATIC FUNCTION PANEL
ALK PHOS: 95 U/L (ref 39–117)
ALT: 30 U/L (ref 0–35)
AST: 17 U/L (ref 0–37)
Albumin: 3.9 g/dL (ref 3.5–5.2)
BILIRUBIN DIRECT: 0.1 mg/dL (ref 0.0–0.3)
Total Bilirubin: 0.4 mg/dL (ref 0.2–1.2)
Total Protein: 6.8 g/dL (ref 6.0–8.3)

## 2015-06-22 LAB — LIPID PANEL
CHOL/HDL RATIO: 6
Cholesterol: 226 mg/dL — ABNORMAL HIGH (ref 0–200)
HDL: 36.1 mg/dL — ABNORMAL LOW (ref >39.00)
LDL Cholesterol: 162 mg/dL — ABNORMAL HIGH (ref 0–99)
NONHDL: 189.88
Triglycerides: 141 mg/dL (ref 0.0–149.0)
VLDL: 28.2 mg/dL (ref 0.0–40.0)

## 2015-06-22 LAB — POCT URINALYSIS DIPSTICK
BILIRUBIN UA: NEGATIVE
Glucose, UA: NEGATIVE
Ketones, UA: NEGATIVE
Leukocytes, UA: NEGATIVE
Nitrite, UA: NEGATIVE
Protein, UA: NEGATIVE
RBC UA: NEGATIVE
SPEC GRAV UA: 1.02
UROBILINOGEN UA: 0.2
pH, UA: 7

## 2015-06-22 NOTE — Progress Notes (Signed)
HPI:  Tracie Johnston is here to establish care.  Last PCP and physical:Unknown   Has the following chronic problems that require follow up and concerns today:  Depression -She feels as though her depression is not well controlled at this time. Reports that she currently has to go see a fertility specialist in order to hopefully get pregnant. Her father-in-law is in poor health as well as other family members not doing very well  health wise. She reports that she does not want to get out of bed in the morning and would like to continuously sleep throughout the day. Fortunately for her she is able to pull herself out of bed and go on with her daily activities. She denies any suicidal ideation or thoughts of self-harm. She will be following up with her psychiatrist next week and her psychotherapist this week  Obesity -She understands that she needs to lose weight. She endorses that she eats fairly healthy, does not eat fast food or does not eat out due to IBS. She walks her dog multiple times per week at approximately 1-1-1/2 miles per walk. He is seen in nutritionist in January. Does not want to be started on any medication for weight loss at this time  ROS negative for unless reported above: fevers, chills,feeling poorly, unintentional weight loss, hearing or vision loss, chest pain, palpitations, leg claudication, struggling to breath,Not feeling congested in the chest, no orthopenia, no cough,no wheezing, normal appetite, no soft tissue swelling, no hemoptysis, melena, hematochezia, hematuria, falls, loc, si, or thoughts of self harm.  Immunizations:UTD -does not want flu Diet: She eats healthy, does not eat fast foods, does not eat out. Fruits and vegetables, chicken.  Exercise: Walks with her dog, multiple times a week. She likes to hike with her friends.  Pap Smear: October 2016 Dental - UTD Eye - UTD  Past Medical History  Diagnosis Date  . Allergy   . Hypothyroid   . Anxiety   . PCOS  (polycystic ovarian syndrome)   . Hyperlipidemia     Past Surgical History  Procedure Laterality Date  . Shoulder surgery  2012    Repair of torn labrum  . Colonoscopy  2005    Normal. IBS  . Dilation and curettage of uterus  03/09/2015    Dr Eulogio Ditch to uterine polyp    Family History  Problem Relation Age of Onset  . Kidney cancer Maternal Grandmother   . Hyperlipidemia Maternal Grandfather   . Heart disease Maternal Grandfather   . Skin cancer Maternal Grandfather   . Prostate cancer Maternal Grandfather   . Colon cancer Maternal Grandfather   . Diabetes Paternal Grandmother     Social History   Social History  . Marital Status: Married    Spouse Name: N/A  . Number of Children: N/A  . Years of Education: N/A   Social History Main Topics  . Smoking status: Never Smoker   . Smokeless tobacco: Never Used  . Alcohol Use: No  . Drug Use: No  . Sexual Activity: Yes    Birth Control/ Protection: None   Other Topics Concern  . None   Social History Narrative     Current outpatient prescriptions:  .  fluticasone (FLONASE) 50 MCG/ACT nasal spray, Place into both nostrils daily., Disp: , Rfl:  .  levothyroxine (SYNTHROID, LEVOTHROID) 175 MCG tablet, Take 1 tablet (175 mcg total) by mouth daily before breakfast., Disp: 90 tablet, Rfl: 2  EXAM:  Filed Vitals:  06/22/15 1015  BP: 132/92  Temp: 98.4 F (36.9 C)    Body mass index is 52.6 kg/(m^2).  GENERAL: vitals reviewed and listed above, alert, oriented, appears well hydrated and in no acute distress. Morbidly obese  HEENT: atraumatic, conjunttiva clear, no obvious abnormalities on inspection of external nose and ears  NECK: Neck is soft and supple without masses, no adenopathy or thyromegaly, trachea midline, no JVD. Normal range of motion.   LUNGS: clear to auscultation bilaterally, no wheezes, rales or rhonchi, good air movement  CV: Regular rate and rhythm, normal S1/S2, no audible murmurs,  gallops, or rubs. No carotid bruit and no peripheral edema.   MS: moves all extremities without noticeable abnormality. No edema noted  Abd: soft/nontender/nondistended/normal bowel sounds   Skin: warm and dry, no rash   Extremities: No clubbing, cyanosis, or edema. Capillary refill is WNL. Pulses intact bilaterally in upper and lower extremities.   Neuro: CN II-XII intact, sensation and reflexes normal throughout, 5/5 muscle strength in bilateral upper and lower extremities. Normal finger to nose. Normal rapid alternating movements. Normal romberg. No pronator drift.   PSYCH: pleasant and cooperative, no obvious depression or anxiety  ASSESSMENT AND PLAN:  1. Routine general medical examination at a health care facility - Basic metabolic panel - CBC with Differential/Platelet - Hemoglobin A1c - Hepatic function panel - Lipid panel - POCT urinalysis dipstick - Follow-up in 1 year for next physical exam -I'll up sooner if needed  2. Morbid obesity, unspecified obesity type ([facility]) Urged to incorporate more aerobic exercise in her diet as well as practice portion control  3. Hypothyroidism, unspecified hypothyroidism type Continue with current Synthroid dose. We'll consider changing that dose depending on what her TSHs   Discussed the following assessment and plan:  No diagnosis found. -We reviewed the PMH, PSH, FH, SH, Meds and Allergies. -We provided refills for any medications we will prescribe as needed. -We addressed current concerns per orders and patient instructions. -We have asked for records for pertinent exams, studies, vaccines and notes from previous providers. -We have advised patient to follow up per instructions below.   -Patient advised to return or notify a provider immediately if symptoms worsen or persist or new concerns arise.  There are no Patient Instructions on file for this visit.   AMR CorporationCory Raysean Graumann

## 2015-06-22 NOTE — Patient Instructions (Addendum)
It was great seeing you again!  Please work on diet and exercise.   Follow up with me in one year for your physical, but please let me know if you need anything in the meantime.     Health Maintenance, Female Adopting a healthy lifestyle and getting preventive care can go a long way to promote health and wellness. Talk with your health care provider about what schedule of regular examinations is right for you. This is a good chance for you to check in with your provider about disease prevention and staying healthy. In between checkups, there are plenty of things you can do on your own. Experts have done a lot of research about which lifestyle changes and preventive measures are most likely to keep you healthy. Ask your health care provider for more information. WEIGHT AND DIET  Eat a healthy diet  Be sure to include plenty of vegetables, fruits, low-fat dairy products, and lean protein.  Do not eat a lot of foods high in solid fats, added sugars, or salt.  Get regular exercise. This is one of the most important things you can do for your health.  Most adults should exercise for at least 150 minutes each week. The exercise should increase your heart rate and make you sweat (moderate-intensity exercise).  Most adults should also do strengthening exercises at least twice a week. This is in addition to the moderate-intensity exercise.  Maintain a healthy weight  Body mass index (BMI) is a measurement that can be used to identify possible weight problems. It estimates body fat based on height and weight. Your health care provider can help determine your BMI and help you achieve or maintain a healthy weight.  For females 75 years of age and older:   A BMI below 18.5 is considered underweight.  A BMI of 18.5 to 24.9 is normal.  A BMI of 25 to 29.9 is considered overweight.  A BMI of 30 and above is considered obese.  Watch levels of cholesterol and blood lipids  You should start  having your blood tested for lipids and cholesterol at 33 years of age, then have this test every 5 years.  You may need to have your cholesterol levels checked more often if:  Your lipid or cholesterol levels are high.  You are older than 33 years of age.  You are at high risk for heart disease.  CANCER SCREENING   Lung Cancer  Lung cancer screening is recommended for adults 62-17 years old who are at high risk for lung cancer because of a history of smoking.  A yearly low-dose CT scan of the lungs is recommended for people who:  Currently smoke.  Have quit within the past 15 years.  Have at least a 30-pack-year history of smoking. A pack year is smoking an average of one pack of cigarettes a day for 1 year.  Yearly screening should continue until it has been 15 years since you quit.  Yearly screening should stop if you develop a health problem that would prevent you from having lung cancer treatment.  Breast Cancer  Practice breast self-awareness. This means understanding how your breasts normally appear and feel.  It also means doing regular breast self-exams. Let your health care provider know about any changes, no matter how small.  If you are in your 20s or 30s, you should have a clinical breast exam (CBE) by a health care provider every 1-3 years as part of a regular health exam.  If  you are 40 or older, have a CBE every year. Also consider having a breast X-ray (mammogram) every year.  If you have a family history of breast cancer, talk to your health care provider about genetic screening.  If you are at high risk for breast cancer, talk to your health care provider about having an MRI and a mammogram every year.  Breast cancer gene (BRCA) assessment is recommended for women who have family members with BRCA-related cancers. BRCA-related cancers include:  Breast.  Ovarian.  Tubal.  Peritoneal cancers.  Results of the assessment will determine the need for  genetic counseling and BRCA1 and BRCA2 testing. Cervical Cancer Your health care provider may recommend that you be screened regularly for cancer of the pelvic organs (ovaries, uterus, and vagina). This screening involves a pelvic examination, including checking for microscopic changes to the surface of your cervix (Pap test). You may be encouraged to have this screening done every 3 years, beginning at age 31.  For women ages 20-65, health care providers may recommend pelvic exams and Pap testing every 3 years, or they may recommend the Pap and pelvic exam, combined with testing for human papilloma virus (HPV), every 5 years. Some types of HPV increase your risk of cervical cancer. Testing for HPV may also be done on women of any age with unclear Pap test results.  Other health care providers may not recommend any screening for nonpregnant women who are considered low risk for pelvic cancer and who do not have symptoms. Ask your health care provider if a screening pelvic exam is right for you.  If you have had past treatment for cervical cancer or a condition that could lead to cancer, you need Pap tests and screening for cancer for at least 20 years after your treatment. If Pap tests have been discontinued, your risk factors (such as having a new sexual partner) need to be reassessed to determine if screening should resume. Some women have medical problems that increase the chance of getting cervical cancer. In these cases, your health care provider may recommend more frequent screening and Pap tests. Colorectal Cancer  This type of cancer can be detected and often prevented.  Routine colorectal cancer screening usually begins at 33 years of age and continues through 33 years of age.  Your health care provider may recommend screening at an earlier age if you have risk factors for colon cancer.  Your health care provider may also recommend using home test kits to check for hidden blood in the  stool.  A small camera at the end of a tube can be used to examine your colon directly (sigmoidoscopy or colonoscopy). This is done to check for the earliest forms of colorectal cancer.  Routine screening usually begins at age 76.  Direct examination of the colon should be repeated every 5-10 years through 33 years of age. However, you may need to be screened more often if early forms of precancerous polyps or small growths are found. Skin Cancer  Check your skin from head to toe regularly.  Tell your health care provider about any new moles or changes in moles, especially if there is a change in a mole's shape or color.  Also tell your health care provider if you have a mole that is larger than the size of a pencil eraser.  Always use sunscreen. Apply sunscreen liberally and repeatedly throughout the day.  Protect yourself by wearing long sleeves, pants, a wide-brimmed hat, and sunglasses whenever you are  outside. HEART DISEASE, DIABETES, AND HIGH BLOOD PRESSURE   High blood pressure causes heart disease and increases the risk of stroke. High blood pressure is more likely to develop in:  People who have blood pressure in the high end of the normal range (130-139/85-89 mm Hg).  People who are overweight or obese.  People who are African American.  If you are 18-39 years of age, have your blood pressure checked every 3-5 years. If you are 40 years of age or older, have your blood pressure checked every year. You should have your blood pressure measured twice--once when you are at a hospital or clinic, and once when you are not at a hospital or clinic. Record the average of the two measurements. To check your blood pressure when you are not at a hospital or clinic, you can use:  An automated blood pressure machine at a pharmacy.  A home blood pressure monitor.  If you are between 55 years and 79 years old, ask your health care provider if you should take aspirin to prevent  strokes.  Have regular diabetes screenings. This involves taking a blood sample to check your fasting blood sugar level.  If you are at a normal weight and have a low risk for diabetes, have this test once every three years after 33 years of age.  If you are overweight and have a high risk for diabetes, consider being tested at a younger age or more often. PREVENTING INFECTION  Hepatitis B  If you have a higher risk for hepatitis B, you should be screened for this virus. You are considered at high risk for hepatitis B if:  You were born in a country where hepatitis B is common. Ask your health care provider which countries are considered high risk.  Your parents were born in a high-risk country, and you have not been immunized against hepatitis B (hepatitis B vaccine).  You have HIV or AIDS.  You use needles to inject street drugs.  You live with someone who has hepatitis B.  You have had sex with someone who has hepatitis B.  You get hemodialysis treatment.  You take certain medicines for conditions, including cancer, organ transplantation, and autoimmune conditions. Hepatitis C  Blood testing is recommended for:  Everyone born from 1945 through 1965.  Anyone with known risk factors for hepatitis C. Sexually transmitted infections (STIs)  You should be screened for sexually transmitted infections (STIs) including gonorrhea and chlamydia if:  You are sexually active and are younger than 33 years of age.  You are older than 33 years of age and your health care provider tells you that you are at risk for this type of infection.  Your sexual activity has changed since you were last screened and you are at an increased risk for chlamydia or gonorrhea. Ask your health care provider if you are at risk.  If you do not have HIV, but are at risk, it may be recommended that you take a prescription medicine daily to prevent HIV infection. This is called pre-exposure prophylaxis  (PrEP). You are considered at risk if:  You are sexually active and do not regularly use condoms or know the HIV status of your partner(s).  You take drugs by injection.  You are sexually active with a partner who has HIV. Talk with your health care provider about whether you are at high risk of being infected with HIV. If you choose to begin PrEP, you should first be tested for HIV.   You should then be tested every 3 months for as long as you are taking PrEP.  PREGNANCY   If you are premenopausal and you may become pregnant, ask your health care provider about preconception counseling.  If you may become pregnant, take 400 to 800 micrograms (mcg) of folic acid every day.  If you want to prevent pregnancy, talk to your health care provider about birth control (contraception). OSTEOPOROSIS AND MENOPAUSE   Osteoporosis is a disease in which the bones lose minerals and strength with aging. This can result in serious bone fractures. Your risk for osteoporosis can be identified using a bone density scan.  If you are 36 years of age or older, or if you are at risk for osteoporosis and fractures, ask your health care provider if you should be screened.  Ask your health care provider whether you should take a calcium or vitamin D supplement to lower your risk for osteoporosis.  Menopause may have certain physical symptoms and risks.  Hormone replacement therapy may reduce some of these symptoms and risks. Talk to your health care provider about whether hormone replacement therapy is right for you.  HOME CARE INSTRUCTIONS   Schedule regular health, dental, and eye exams.  Stay current with your immunizations.   Do not use any tobacco products including cigarettes, chewing tobacco, or electronic cigarettes.  If you are pregnant, do not drink alcohol.  If you are breastfeeding, limit how much and how often you drink alcohol.  Limit alcohol intake to no more than 1 drink per day for  nonpregnant women. One drink equals 12 ounces of beer, 5 ounces of wine, or 1 ounces of hard liquor.  Do not use street drugs.  Do not share needles.  Ask your health care provider for help if you need support or information about quitting drugs.  Tell your health care provider if you often feel depressed.  Tell your health care provider if you have ever been abused or do not feel safe at home.   This information is not intended to replace advice given to you by your health care provider. Make sure you discuss any questions you have with your health care provider.   Document Released: 01/21/2011 Document Revised: 07/29/2014 Document Reviewed: 06/09/2013 Elsevier Interactive Patient Education [employer].

## 2015-06-24 ENCOUNTER — Other Ambulatory Visit: Payer: Self-pay | Admitting: Internal Medicine

## 2015-08-01 ENCOUNTER — Encounter: Payer: Self-pay | Admitting: Skilled Nursing Facility1

## 2015-08-01 ENCOUNTER — Encounter: Payer: Managed Care, Other (non HMO) | Attending: Obstetrics & Gynecology | Admitting: Skilled Nursing Facility1

## 2015-08-01 DIAGNOSIS — Z6841 Body Mass Index (BMI) 40.0 and over, adult: Secondary | ICD-10-CM | POA: Insufficient documentation

## 2015-08-01 DIAGNOSIS — Z713 Dietary counseling and surveillance: Secondary | ICD-10-CM | POA: Diagnosis not present

## 2015-08-01 NOTE — Progress Notes (Signed)
  Medical Nutrition Therapy:  Appt start time: 1200 end time:  1300.   Assessment:  Primary concerns today: referred for obesity. Pt states she is trying to conceive and has fatty liver disease, PCOS and would like to lose weight. Pt states her sleep is not good but just started melanin-up and down every 2 hours-been this way for 1.5 months which is when she found out she needs a fertility clinic and parents failing health. Pt states she is seeing a phychiatrist/therapist. Pt states she does not do anything in particular throughout the day but watch television. [place] 2 [place] ago. Pt states she is very aware she has a thyroid disease and feels she is not as mentally quick as she used to be. Pts stated [place]40 [place] and gained it back 6 months later; weight watchers with no success. Pt states her usual weight has been about 300 pounds. Pt states she eats in front of the television.  Cholesterol 226; LDL 162  Preferred Learning Style:   No preference indicated   Learning Readiness:   Not ready  MEDICATIONS: See List   DIETARY INTAKE:  Usual eating pattern includes 2 meals and 2 snacks per day.  Everyday foods include none stated.  Avoided foods include none stated.    24-hr recall:  B ( AM): cereal----oatmeal----eggs with peppers and onions Snk ( AM): can of pineapple L ( PM): popcorn-----none----pumpkin pie almonds Snk ( PM): none D ( PM): chicken, ravioli-----out to eat Snk ( PM): ice cream---brownie Beverages: soda, water, milk, juice  *meals outside the home: 5 Usual physical activity: ADL's  Estimated energy needs: 1800 calories 200 g carbohydrates 135 g protein 50 g fat  Progress Towards Goal(s):  In progress.   Nutritional Diagnosis:  [place]-3.3 Overweight/obesity As related to overconsumption/physical inactivity.  As evidenced by BMI 49.34, pt report, and 24 hr recall.    Intervention:  Nutrition counseling for  obesity. Dietitian educated the pt on hunger/fullness cues, portions, balanced meals, and physical activity. Dietitian also educated the pt on PCOS and carbohydrates.  Goals: -Stress Relief: yoga, meditation, walking the dog, soccer/any sport, crafting -Start walking to the dog park again 3 days a week, inside the house yoga/tug of war -The goal is to mainly drink water -Measures of success: clothes size, more energy, and more physically fit -Plan out your meals -Try not to use butter -Eat your meals and snacks at the table  Teaching Method Utilized:  Visual Auditory  Handouts given during visit include:  Snack sheet  Barriers to learning/adherence to lifestyle change: Stress  Demonstrated degree of understanding via:  Teach Back   Monitoring/Evaluation:  Dietary intake, exercise, Cholesterol, and body weight prn.

## 2015-08-01 NOTE — Patient Instructions (Signed)
-  Stress Relief: yoga, meditation, walking the dog, soccer/any sport, crafting -Start walking to the dog park again 3 days a week, inside the house yoga/tug of war -The goal is to mainly drink water -Measures of success: clothes size, more energy, and more physically fit -Plan out your meals -Try not to use butter -Eat your meals and snacks at the table

## 2015-08-16 ENCOUNTER — Ambulatory Visit: Payer: Managed Care, Other (non HMO) | Admitting: Adult Health

## 2015-08-24 ENCOUNTER — Ambulatory Visit (INDEPENDENT_AMBULATORY_CARE_PROVIDER_SITE_OTHER): Payer: Managed Care, Other (non HMO) | Admitting: Adult Health

## 2015-08-24 ENCOUNTER — Encounter: Payer: Self-pay | Admitting: Adult Health

## 2015-08-24 VITALS — BP 122/70 | Temp 99.1°F | Ht 67.0 in | Wt 324.0 lb

## 2015-08-24 DIAGNOSIS — R1032 Left lower quadrant pain: Secondary | ICD-10-CM

## 2015-08-24 DIAGNOSIS — N926 Irregular menstruation, unspecified: Secondary | ICD-10-CM | POA: Diagnosis not present

## 2015-08-24 LAB — CBC WITH DIFFERENTIAL/PLATELET
BASOS PCT: 0.8 % (ref 0.0–3.0)
Basophils Absolute: 0.1 10*3/uL (ref 0.0–0.1)
Eosinophils Absolute: 0.6 10*3/uL (ref 0.0–0.7)
Eosinophils Relative: 5.2 % — ABNORMAL HIGH (ref 0.0–5.0)
HEMATOCRIT: 45.8 % (ref 36.0–46.0)
HEMOGLOBIN: 14.9 g/dL (ref 12.0–15.0)
LYMPHS PCT: 20.9 % (ref 12.0–46.0)
Lymphs Abs: 2.3 10*3/uL (ref 0.7–4.0)
MCHC: 32.6 g/dL (ref 30.0–36.0)
MCV: 80.9 fl (ref 78.0–100.0)
MONOS PCT: 6.2 % (ref 3.0–12.0)
Monocytes Absolute: 0.7 10*3/uL (ref 0.1–1.0)
NEUTROS ABS: 7.3 10*3/uL (ref 1.4–7.7)
Neutrophils Relative %: 66.9 % (ref 43.0–77.0)
Platelets: 385 10*3/uL (ref 150.0–400.0)
RBC: 5.66 Mil/uL — ABNORMAL HIGH (ref 3.87–5.11)
RDW: 16.5 % — ABNORMAL HIGH (ref 11.5–15.5)
WBC: 10.8 10*3/uL — ABNORMAL HIGH (ref 4.0–10.5)

## 2015-08-24 LAB — BASIC METABOLIC PANEL
BUN: 11 mg/dL (ref 6–23)
CHLORIDE: 102 meq/L (ref 96–112)
CO2: 26 mEq/L (ref 19–32)
Calcium: 9.3 mg/dL (ref 8.4–10.5)
Creatinine, Ser: 0.66 mg/dL (ref 0.40–1.20)
GFR: 109.37 mL/min (ref >60.00)
GLUCOSE: 118 mg/dL — AB (ref 70–99)
Potassium: 3.8 mEq/L (ref 3.5–5.1)
SODIUM: 139 meq/L (ref 135–145)

## 2015-08-24 LAB — POCT URINALYSIS DIPSTICK
Bilirubin, UA: NEGATIVE
Blood, UA: NEGATIVE
GLUCOSE UA: NEGATIVE
KETONES UA: NEGATIVE
Leukocytes, UA: NEGATIVE
Nitrite, UA: NEGATIVE
Protein, UA: NEGATIVE
Urobilinogen, UA: 0.2
pH, UA: 5.5

## 2015-08-24 LAB — TSH: TSH: 1.53 u[IU]/mL (ref 0.35–4.50)

## 2015-08-24 LAB — POCT URINE PREGNANCY: Preg Test, Ur: NEGATIVE

## 2015-08-24 LAB — HCG, QUANTITATIVE, PREGNANCY: Quantitative HCG: 0.1 m[IU]/mL

## 2015-08-24 NOTE — Addendum Note (Signed)
Addended by: Azucena Freed on: 08/24/2015 02:34 PM   Modules accepted: Orders

## 2015-08-24 NOTE — Progress Notes (Signed)
Pre visit review using our clinic review tool, if applicable. No additional management support is needed unless otherwise documented below in the visit note. 
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
## 2015-08-24 NOTE — Patient Instructions (Signed)
It was great seeing you again!  I will follow up with you once your blood tests come back.   Someone will call you to schedule your ultrasounds.   Follow up with GYN

## 2015-08-28 ENCOUNTER — Other Ambulatory Visit: Payer: Self-pay | Admitting: Adult Health

## 2015-08-28 DIAGNOSIS — R1010 Upper abdominal pain, unspecified: Secondary | ICD-10-CM

## 2015-08-29 ENCOUNTER — Ambulatory Visit
Admission: RE | Admit: 2015-08-29 | Discharge: 2015-08-29 | Disposition: A | Discharge status: Home / Self Care | Payer: Managed Care, Other (non HMO) | Source: Ambulatory Visit | Attending: Adult Health | Admitting: Adult Health

## 2015-08-29 ENCOUNTER — Other Ambulatory Visit: Payer: Self-pay

## 2015-08-29 ENCOUNTER — Other Ambulatory Visit: Payer: Managed Care, Other (non HMO)

## 2015-08-29 ENCOUNTER — Telehealth: Payer: Self-pay | Admitting: Adult Health

## 2015-08-29 ENCOUNTER — Inpatient Hospital Stay: Admission: RE | Admit: 2015-08-29 | Payer: Managed Care, Other (non HMO) | Source: Ambulatory Visit

## 2015-08-29 DIAGNOSIS — R1032 Left lower quadrant pain: Secondary | ICD-10-CM

## 2015-08-29 DIAGNOSIS — R1010 Upper abdominal pain, unspecified: Secondary | ICD-10-CM

## 2015-08-29 DIAGNOSIS — N926 Irregular menstruation, unspecified: Secondary | ICD-10-CM

## 2015-08-29 NOTE — Telephone Encounter (Signed)
Called and spoke to [place] regarding her ultrasound results.

## 2015-09-26 ENCOUNTER — Other Ambulatory Visit: Payer: Self-pay | Admitting: Internal Medicine

## 2015-09-27 ENCOUNTER — Encounter: Payer: Self-pay | Admitting: Adult Health

## 2015-09-27 ENCOUNTER — Ambulatory Visit (INDEPENDENT_AMBULATORY_CARE_PROVIDER_SITE_OTHER): Payer: Managed Care, Other (non HMO) | Admitting: Adult Health

## 2015-09-27 VITALS — BP 118/72 | HR 87 | Temp 98.3°F | Ht 67.0 in | Wt 329.3 lb

## 2015-09-27 DIAGNOSIS — R5383 Other fatigue: Secondary | ICD-10-CM | POA: Diagnosis not present

## 2015-09-27 LAB — CBC WITH DIFFERENTIAL/PLATELET
BASOS ABS: 0.1 10*3/uL (ref 0.0–0.1)
Basophils Relative: 1.2 % (ref 0.0–3.0)
EOS PCT: 4.9 % (ref 0.0–5.0)
Eosinophils Absolute: 0.5 10*3/uL (ref 0.0–0.7)
HEMATOCRIT: 44.3 % (ref 36.0–46.0)
Hemoglobin: 14.7 g/dL (ref 12.0–15.0)
Lymphocytes Relative: 20 % (ref 12.0–46.0)
Lymphs Abs: 2 10*3/uL (ref 0.7–4.0)
MCHC: 33.2 g/dL (ref 30.0–36.0)
MCV: 80.3 fl (ref 78.0–100.0)
MONOS PCT: 6.9 % (ref 3.0–12.0)
Monocytes Absolute: 0.7 10*3/uL (ref 0.1–1.0)
NEUTROS ABS: 6.8 10*3/uL (ref 1.4–7.7)
Neutrophils Relative %: 67 % (ref 43.0–77.0)
PLATELETS: 249 10*3/uL (ref 150.0–400.0)
RBC: 5.52 Mil/uL — ABNORMAL HIGH (ref 3.87–5.11)
RDW: 16.1 % — ABNORMAL HIGH (ref 11.5–15.5)
WBC: 10.2 10*3/uL (ref 4.0–10.5)

## 2015-09-27 LAB — TSH: TSH: 1.33 u[IU]/mL (ref 0.35–4.50)

## 2015-09-27 NOTE — Patient Instructions (Addendum)
It was great seeing you today!  I will follow up with you regarding your blood work and then we will decide on what to do.    Make sure you are getting outside every day.   Try and go to bed and get up at the same time every day.   Fatigue Fatigue is feeling tired all of the time, a lack of energy, or a lack of motivation. Occasional or mild fatigue is often a normal response to activity or life in general. However, long-lasting (chronic) or extreme fatigue may indicate an underlying medical condition. HOME CARE INSTRUCTIONS  Watch your fatigue for any changes. The following actions may help to lessen any discomfort you are feeling:  Talk to your health care provider about how much sleep you need each night. Try to get the required amount every night.  Take medicines only as directed by your health care provider.  Eat a healthy and nutritious diet. Ask your health care provider if you need help changing your diet.  Drink enough fluid to keep your urine clear or pale yellow.  Practice ways of relaxing, such as yoga, meditation, massage therapy, or acupuncture.  Exercise regularly.   Change situations that cause you stress. Try to keep your work and personal routine reasonable.  Do not abuse illegal drugs.  Limit alcohol intake to no more than 1 drink per day for nonpregnant women and 2 drinks per day for men. One drink equals 12 ounces of beer, 5 ounces of wine, or 1 ounces of hard liquor.  Take a multivitamin, if directed by your health care provider. SEEK MEDICAL CARE IF:   Your fatigue does not get better.  You have a fever.   You have unintentional weight loss or gain.  You have headaches.   You have difficulty:   Falling asleep.  Sleeping throughout the night.  You feel angry, guilty, anxious, or sad.   You are unable to have a bowel movement (constipation).   You skin is dry.   Your legs or another part of your body is swollen.  SEEK IMMEDIATE  MEDICAL CARE IF:   You feel confused.   Your vision is blurry.  You feel faint or pass out.   You have a severe headache.   You have severe abdominal, pelvic, or back pain.   You have chest pain, shortness of breath, or an irregular or fast heartbeat.   You are unable to urinate or you urinate less than normal.   You develop abnormal bleeding, such as bleeding from the rectum, vagina, nose, lungs, or nipples.  You vomit blood.   You have thoughts about harming yourself or committing suicide.   You are worried that you might harm someone else.    This information is not intended to replace advice given to you by your health care provider. Make sure you discuss any questions you have with your health care provider.   Document Released: 05/05/2007 Document Revised: 07/29/2014 Document Reviewed: 11/09/2013 Elsevier Interactive Patient Education [employer]

## 2015-09-27 NOTE — Progress Notes (Signed)
Pre visit review using our clinic review tool, if applicable. No additional management support is needed unless otherwise documented below in the visit note. 
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
## 2015-09-28 LAB — IRON,TIBC AND FERRITIN PANEL
%SAT: 22 % (ref 11–50)
FERRITIN: 51 ng/mL (ref 10–154)
Iron: 84 ug/dL (ref 40–190)
TIBC: 389 ug/dL (ref 250–450)

## 2015-10-01 LAB — VITAMIN D 1,25 DIHYDROXY
VITAMIN D 1, 25 (OH) TOTAL: 66 pg/mL (ref 18–72)
Vitamin D3 1, 25 (OH)2: 66 pg/mL

## 2015-12-25 ENCOUNTER — Other Ambulatory Visit: Payer: Self-pay | Admitting: Internal Medicine

## 2016-01-25 ENCOUNTER — Encounter: Payer: Self-pay | Admitting: Adult Health

## 2016-01-25 ENCOUNTER — Ambulatory Visit (INDEPENDENT_AMBULATORY_CARE_PROVIDER_SITE_OTHER): Payer: Managed Care, Other (non HMO) | Admitting: Adult Health

## 2016-01-25 VITALS — BP 124/78 | Temp 98.1°F | Wt 331.9 lb

## 2016-01-25 DIAGNOSIS — J302 Other seasonal allergic rhinitis: Secondary | ICD-10-CM

## 2016-01-25 NOTE — Progress Notes (Signed)
Pre visit review using our clinic review tool, if applicable. No additional management support is needed unless otherwise documented below in the visit note. 

## 2016-01-25 NOTE — Progress Notes (Signed)
   Subjective:    Patient ID: Quintella ReichertLisa Rotenberg, female    DOB: [birth year], 35 y.o.   MRN: [id number]  HPI  35 year old female who presents to the office today for an acute issue of three days of nasal congestion, itchy watery eyes and the feeling of ears being clogged.   She has been using Flonase in the past but found that after a few months of this treatment she gets nose bleeds. She feels as though the Flonase works well for her seasonal allergies.   Has been using Zyrtec with slight relief but not as good of relief as Flonase  She denies any fevers, chest congestion, or feeling acutely ill   Review of Systems  Constitutional: Negative.   HENT: Positive for congestion, nosebleeds, rhinorrhea and sinus pressure. Negative for postnasal drip.   Eyes: Positive for discharge and itching. Negative for photophobia, pain, redness and visual disturbance.  Respiratory: Negative.   Neurological: Negative.   All other systems reviewed and are negative.      Objective:   Physical Exam  Constitutional: She is oriented to person, place, and time. She appears well-developed and well-nourished. No distress.  HENT:  Head: Normocephalic and atraumatic.  Right Ear: Hearing, tympanic membrane, external ear and ear canal normal.  Left Ear: Hearing, tympanic membrane, external ear and ear canal normal.  Nose: Rhinorrhea present. No mucosal edema. No epistaxis. Right sinus exhibits no maxillary sinus tenderness and no frontal sinus tenderness. Left sinus exhibits no maxillary sinus tenderness and no frontal sinus tenderness.  Mouth/Throat: Uvula is midline and oropharynx is clear and moist. No oropharyngeal exudate.  Eyes: Conjunctivae and EOM are normal. Pupils are equal, round, and reactive to light. Right eye exhibits no discharge. Left eye exhibits no discharge.  Neck: Normal range of motion. Neck supple. No thyromegaly present.  Cardiovascular: Normal rate, regular rhythm, normal heart sounds and intact  distal pulses.  Exam reveals no gallop and no friction rub.   No murmur heard. Pulmonary/Chest: Effort normal and breath sounds normal. No respiratory distress. She has no wheezes. She has no rales. She exhibits no tenderness.  Lymphadenopathy:    She has no cervical adenopathy.  Neurological: She is alert and oriented to person, place, and time.  Skin: Skin is warm and dry. No rash noted. She is not diaphoretic. No erythema. No pallor.  Psychiatric: She has a normal mood and affect. Her behavior is normal. Judgment and thought content normal.  Nursing note and vitals reviewed.     Assessment & Plan:  1. Seasonal allergies - She would like to try using normal saline spray prior to Flonase.  - I recommended xyzal. She will follow up with me if the normal saline spray/flonase does not work to control nose bleeds.   Shirline Freesory Kona Yusuf, NP

## 2016-07-03 ENCOUNTER — Telehealth: Payer: Self-pay | Admitting: Adult Health

## 2016-07-03 MED ORDER — LEVOTHYROXINE SODIUM 175 MCG PO TABS: ORAL_TABLET | ORAL | 0 refills | Status: DC

## 2016-07-03 NOTE — Telephone Encounter (Signed)
Sent to the pharmacy by e-scribe for 90 days.  Last TSH was 09/2015 and normal.  Pt will need to schedule CPX.

## 2016-07-03 NOTE — Telephone Encounter (Signed)
Pt is in [place] caring for her dying mother in law, cannot return at this time. Would like a refill of levothyroxine (SYNTHROID, LEVOTHROID) 175 MCG tablet 90 days [facility]  [place], [place]  [phone]

## 2016-08-05 ENCOUNTER — Encounter: Payer: Self-pay | Admitting: Family Medicine

## 2016-08-05 ENCOUNTER — Ambulatory Visit (INDEPENDENT_AMBULATORY_CARE_PROVIDER_SITE_OTHER): Payer: Managed Care, Other (non HMO) | Admitting: Family Medicine

## 2016-08-05 VITALS — BP 116/82 | HR 78 | Temp 97.8°F | Ht 67.0 in | Wt 347.0 lb

## 2016-08-05 DIAGNOSIS — J069 Acute upper respiratory infection, unspecified: Secondary | ICD-10-CM

## 2016-08-05 MED ORDER — BENZONATATE 100 MG PO CAPS: 100.0000 mg | ORAL_CAPSULE | Freq: Two times a day (BID) | ORAL | 0 refills | Status: DC | PRN
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
## 2016-08-05 NOTE — Patient Instructions (Addendum)
It was nice to meet you today and I hope that you feel better soon!  Continue the doxycycline and the prednisone to complete.  Use the albuterol only as needed.  I sent Tessalon cough medication to your pharmacy.  Make sure to eat healthy diet and drink plenty of fluids.  Follow up if worsening, new concerns or symptoms persist despite treatment.

## 2016-08-05 NOTE — Progress Notes (Signed)
Pre visit review using our clinic review tool, if applicable. No additional management support is needed unless otherwise documented below in the visit note. 

## 2016-09-24 ENCOUNTER — Ambulatory Visit (INDEPENDENT_AMBULATORY_CARE_PROVIDER_SITE_OTHER): Payer: Managed Care, Other (non HMO) | Admitting: Adult Health

## 2016-09-24 ENCOUNTER — Encounter: Payer: Self-pay | Admitting: Adult Health

## 2016-09-24 VITALS — Temp 98.1°F | Ht 67.0 in | Wt 345.2 lb

## 2016-09-24 DIAGNOSIS — R1030 Lower abdominal pain, unspecified: Secondary | ICD-10-CM | POA: Diagnosis not present

## 2016-09-24 DIAGNOSIS — F339 Major depressive disorder, recurrent, unspecified: Secondary | ICD-10-CM | POA: Diagnosis not present

## 2016-09-24 DIAGNOSIS — E039 Hypothyroidism, unspecified: Secondary | ICD-10-CM | POA: Diagnosis not present

## 2016-09-24 LAB — POCT URINALYSIS DIPSTICK
Bilirubin, UA: NEGATIVE
GLUCOSE UA: NEGATIVE
Ketones, UA: NEGATIVE
Leukocytes, UA: NEGATIVE
Nitrite, UA: NEGATIVE
Protein, UA: NEGATIVE
RBC UA: NEGATIVE
SPEC GRAV UA: 1.01
Urobilinogen, UA: NEGATIVE
pH, UA: 6

## 2016-09-24 LAB — COMPREHENSIVE METABOLIC PANEL
ALT: 25 U/L (ref 0–35)
AST: 15 U/L (ref 0–37)
Albumin: 3.8 g/dL (ref 3.5–5.2)
Alkaline Phosphatase: 99 U/L (ref 39–117)
BUN: 8 mg/dL (ref 6–23)
CALCIUM: 9.3 mg/dL (ref 8.4–10.5)
CHLORIDE: 104 meq/L (ref 96–112)
CO2: 31 mEq/L (ref 19–32)
Creatinine, Ser: 0.68 mg/dL (ref 0.40–1.20)
GFR: 104.98 mL/min (ref >60.00)
Glucose, Bld: 103 mg/dL — ABNORMAL HIGH (ref 70–99)
POTASSIUM: 3.8 meq/L (ref 3.5–5.1)
SODIUM: 141 meq/L (ref 135–145)
Total Bilirubin: 0.4 mg/dL (ref 0.2–1.2)
Total Protein: 6.7 g/dL (ref 6.0–8.3)

## 2016-09-24 LAB — CBC WITH DIFFERENTIAL/PLATELET
BASOS PCT: 0.4 % (ref 0.0–3.0)
Basophils Absolute: 0 10*3/uL (ref 0.0–0.1)
EOS PCT: 2.8 % (ref 0.0–5.0)
Eosinophils Absolute: 0.3 10*3/uL (ref 0.0–0.7)
HEMATOCRIT: 43.4 % (ref 36.0–46.0)
HEMOGLOBIN: 14.8 g/dL (ref 12.0–15.0)
Lymphocytes Relative: 22.4 % (ref 12.0–46.0)
Lymphs Abs: 2.4 10*3/uL (ref 0.7–4.0)
MCHC: 34.2 g/dL (ref 30.0–36.0)
MCV: 84.2 fl (ref 78.0–100.0)
MONO ABS: 0.9 10*3/uL (ref 0.1–1.0)
Monocytes Relative: 8.1 % (ref 3.0–12.0)
Neutro Abs: 7.2 10*3/uL (ref 1.4–7.7)
Neutrophils Relative %: 66.3 % (ref 43.0–77.0)
Platelets: 346 10*3/uL (ref 150.0–400.0)
RBC: 5.15 Mil/uL — ABNORMAL HIGH (ref 3.87–5.11)
RDW: 14.3 % (ref 11.5–15.5)
WBC: 10.8 10*3/uL — AB (ref 4.0–10.5)

## 2016-09-24 LAB — LIPASE: LIPASE: 23 U/L (ref 11.0–59.0)

## 2016-09-24 LAB — T3, FREE: T3, Free: 4.2 pg/mL (ref 2.3–4.2)

## 2016-09-24 LAB — TSH: TSH: 2.37 u[IU]/mL (ref 0.35–4.50)

## 2016-09-24 LAB — POCT URINE PREGNANCY: PREG TEST UR: NEGATIVE

## 2016-09-24 LAB — T4, FREE: Free T4: 0.91 ng/dL (ref 0.60–1.60)
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
## 2016-09-25 ENCOUNTER — Ambulatory Visit (INDEPENDENT_AMBULATORY_CARE_PROVIDER_SITE_OTHER)
Admission: RE | Admit: 2016-09-25 | Discharge: 2016-09-25 | Disposition: A | Discharge status: Home / Self Care | Payer: Managed Care, Other (non HMO) | Source: Ambulatory Visit | Attending: Adult Health | Admitting: Adult Health

## 2016-09-25 ENCOUNTER — Telehealth: Payer: Self-pay | Admitting: Adult Health

## 2016-09-25 DIAGNOSIS — R1030 Lower abdominal pain, unspecified: Secondary | ICD-10-CM | POA: Diagnosis not present

## 2016-09-25 MED ORDER — IOPAMIDOL (ISOVUE-300) INJECTION 61%
100.0000 mL | Freq: Once | INTRAVENOUS | Status: AC | PRN
  Administered 2016-09-25: 100 mL via INTRAVENOUS

## 2016-09-25 NOTE — Telephone Encounter (Signed)
Left VM with CT of abdomen results. Exam normal except for fatty liver.   Advised to call back with any questions

## 2016-10-08 ENCOUNTER — Other Ambulatory Visit: Payer: Self-pay | Admitting: Family Medicine

## 2017-01-06 ENCOUNTER — Other Ambulatory Visit: Payer: Self-pay | Admitting: Adult Health

## 2017-04-09 ENCOUNTER — Other Ambulatory Visit: Payer: Self-pay | Admitting: Adult Health

## 2017-04-09 NOTE — Telephone Encounter (Signed)
Ok to refill for 6 months 

## 2017-04-09 NOTE — Telephone Encounter (Signed)
Sent to the pharmacy by e-scribe. 

## 2017-04-11 ENCOUNTER — Encounter: Payer: Self-pay | Admitting: Adult Health

## 2017-07-10 ENCOUNTER — Other Ambulatory Visit: Payer: Self-pay | Admitting: Adult Health

## 2017-07-10 MED ORDER — LEVOTHYROXINE SODIUM 175 MCG PO TABS: ORAL_TABLET | ORAL | 1 refills | Status: DC

## 2017-09-10 ENCOUNTER — Ambulatory Visit: Payer: Managed Care, Other (non HMO) | Admitting: Family Medicine

## 2017-09-10 ENCOUNTER — Encounter: Payer: Self-pay | Admitting: Family Medicine

## 2017-09-10 VITALS — BP 148/88 | HR 96 | Temp 98.2°F | Wt 363.0 lb

## 2017-09-10 DIAGNOSIS — B9789 Other viral agents as the cause of diseases classified elsewhere: Secondary | ICD-10-CM | POA: Diagnosis not present

## 2017-09-10 DIAGNOSIS — J069 Acute upper respiratory infection, unspecified: Secondary | ICD-10-CM | POA: Diagnosis not present

## 2017-09-10 NOTE — Patient Instructions (Addendum)
Upper Respiratory Infection, Adult Most upper respiratory infections (URIs) are a viral infection of the air passages leading to the lungs. A URI affects the nose, throat, and upper air passages. The most common type of URI is nasopharyngitis and is typically referred to as "the common cold." URIs run their course and usually go away on their own. Most of the time, a URI does not require medical attention, but sometimes a bacterial infection in the upper airways can follow a viral infection. This is called a secondary infection. Sinus and middle ear infections are common types of secondary upper respiratory infections. Bacterial pneumonia can also complicate a URI. A URI can worsen asthma and chronic obstructive pulmonary disease (COPD). Sometimes, these complications can require emergency medical care and may be life threatening. What are the causes? Almost all URIs are caused by viruses. A virus is a type of germ and can spread from one person to another. What increases the risk? You may be at risk for a URI if:  You smoke.  You have chronic heart or lung disease.  You have a weakened defense (immune) system.  You are very young or very old.  You have nasal allergies or asthma.  You work in crowded or poorly ventilated areas.  You work in health care facilities or schools.  What are the signs or symptoms? Symptoms typically develop 2-3 days after you come in contact with a cold virus. Most viral URIs last 7-10 days. However, viral URIs from the influenza virus (flu virus) can last 14-18 days and are typically more severe. Symptoms may include:  Runny or stuffy (congested) nose.  Sneezing.  Cough.  Sore throat.  Headache.  Fatigue.  Fever.  Loss of appetite.  Pain in your forehead, behind your eyes, and over your cheekbones (sinus pain).  Muscle aches.  How is this diagnosed? Your health care provider may diagnose a URI by:  Physical exam.  Tests to check that your  symptoms are not due to another condition such as: ? Strep throat. ? Sinusitis. ? Pneumonia. ? Asthma.  How is this treated? A URI goes away on its own with time. It cannot be cured with medicines, but medicines may be prescribed or recommended to relieve symptoms. Medicines may help:  Reduce your fever.  Reduce your cough.  Relieve nasal congestion.  Follow these instructions at home:  Take medicines only as directed by your health care provider.  Gargle warm saltwater or take cough drops to comfort your throat as directed by your health care provider.  Use a warm mist humidifier or inhale steam from a shower to increase air moisture. This may make it easier to breathe.  Drink enough fluid to keep your urine clear or pale yellow.  Eat soups and other clear broths and maintain good nutrition.  Rest as needed.  Return to work when your temperature has returned to normal or as your health care provider advises. You may need to stay home longer to avoid infecting others. You can also use a face mask and careful hand washing to prevent spread of the virus.  Increase the usage of your inhaler if you have asthma.  Do not use any tobacco products, including cigarettes, chewing tobacco, or electronic cigarettes. If you need help quitting, ask your health care provider. How is this prevented? The best way to protect yourself from getting a cold is to practice good hygiene.  Avoid oral or hand contact with people with cold symptoms.  Wash your   hands often if contact occurs.  There is no clear evidence that vitamin C, vitamin E, echinacea, or exercise reduces the chance of developing a cold. However, it is always recommended to get plenty of rest, exercise, and practice good nutrition. Contact a health care provider if:  You are getting worse rather than better.  Your symptoms are not controlled by medicine.  You have chills.  You have worsening shortness of breath.  You have  brown or red mucus.  You have yellow or brown nasal discharge.  You have pain in your face, especially when you bend forward.  You have a fever.  You have swollen neck glands.  You have pain while swallowing.  You have white areas in the back of your throat. Get help right away if:  You have severe or persistent: ? Headache. ? Ear pain. ? Sinus pain. ? Chest pain.  You have chronic lung disease and any of the following: ? Wheezing. ? Prolonged cough. ? Coughing up blood. ? A change in your usual mucus.  You have a stiff neck.  You have changes in your: ? Vision. ? Hearing. ? Thinking. ? Mood. This information is not intended to replace advice given to you by your health care provider. Make sure you discuss any questions you have with your health care provider. Document Released: 01/01/2001 Document Revised: 03/10/2016 Document Reviewed: 10/13/2013 Elsevier Interactive Patient Education  2018 [employer]  
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
## 2017-09-23 ENCOUNTER — Other Ambulatory Visit: Payer: Self-pay | Admitting: Adult Health

## 2017-09-23 MED ORDER — LEVOTHYROXINE SODIUM 175 MCG PO TABS: ORAL_TABLET | ORAL | 0 refills | Status: DC

## 2017-09-23 NOTE — Telephone Encounter (Signed)
Copied from CRM [phone]. Topic: General - Other >> Sep 23, 2017 12:16 PM Cecelia ByarsGreen, Jozeph Persing L, RMA wrote: Reason for CRM: Medication refill request for levothyroxine (SYNTHROID, LEVOTHROID) 175 MCG [facility]

## 2017-09-23 NOTE — Telephone Encounter (Signed)
Medication filled to pharmacy as requested. Called patient and scheduled CPE for next week as she is overdue.

## 2017-09-30 ENCOUNTER — Encounter: Payer: Self-pay | Admitting: Adult Health

## 2017-09-30 ENCOUNTER — Ambulatory Visit (INDEPENDENT_AMBULATORY_CARE_PROVIDER_SITE_OTHER): Payer: Managed Care, Other (non HMO) | Admitting: Adult Health

## 2017-09-30 VITALS — BP 138/84 | Temp 98.3°F | Ht 66.5 in | Wt 357.0 lb

## 2017-09-30 DIAGNOSIS — E039 Hypothyroidism, unspecified: Secondary | ICD-10-CM | POA: Diagnosis not present

## 2017-09-30 DIAGNOSIS — Z0000 Encounter for general adult medical examination without abnormal findings: Secondary | ICD-10-CM | POA: Diagnosis not present

## 2017-09-30 DIAGNOSIS — Z114 Encounter for screening for human immunodeficiency virus [HIV]: Secondary | ICD-10-CM | POA: Diagnosis not present
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
## 2017-10-01 ENCOUNTER — Other Ambulatory Visit: Payer: Self-pay | Admitting: Adult Health

## 2017-10-01 LAB — LIPID PANEL
CHOL/HDL RATIO: 7
CHOLESTEROL: 250 mg/dL — AB (ref 0–200)
HDL: 36.9 mg/dL — AB (ref >39.00)
NonHDL: 212.87
Triglycerides: 216 mg/dL — ABNORMAL HIGH (ref 0.0–149.0)
VLDL: 43.2 mg/dL — AB (ref 0.0–40.0)

## 2017-10-01 LAB — CBC WITH DIFFERENTIAL/PLATELET
BASOS PCT: 0.9 % (ref 0.0–3.0)
Basophils Absolute: 0.1 10*3/uL (ref 0.0–0.1)
EOS PCT: 3.1 % (ref 0.0–5.0)
Eosinophils Absolute: 0.3 10*3/uL (ref 0.0–0.7)
HEMATOCRIT: 42.8 % (ref 36.0–46.0)
HEMOGLOBIN: 14.3 g/dL (ref 12.0–15.0)
Lymphocytes Relative: 24.4 % (ref 12.0–46.0)
Lymphs Abs: 2.1 10*3/uL (ref 0.7–4.0)
MCHC: 33.4 g/dL (ref 30.0–36.0)
MCV: 81.4 fl (ref 78.0–100.0)
MONOS PCT: 7.2 % (ref 3.0–12.0)
Monocytes Absolute: 0.6 10*3/uL (ref 0.1–1.0)
Neutro Abs: 5.5 10*3/uL (ref 1.4–7.7)
Neutrophils Relative %: 64.4 % (ref 43.0–77.0)
Platelets: 348 10*3/uL (ref 150.0–400.0)
RBC: 5.26 Mil/uL — AB (ref 3.87–5.11)
RDW: 15.9 % — ABNORMAL HIGH (ref 11.5–15.5)
WBC: 8.5 10*3/uL (ref 4.0–10.5)

## 2017-10-01 LAB — COMPREHENSIVE METABOLIC PANEL
ALBUMIN: 3.8 g/dL (ref 3.5–5.2)
ALK PHOS: 89 U/L (ref 39–117)
ALT: 34 U/L (ref 0–35)
AST: 20 U/L (ref 0–37)
BUN: 7 mg/dL (ref 6–23)
CHLORIDE: 101 meq/L (ref 96–112)
CO2: 30 mEq/L (ref 19–32)
Calcium: 9.4 mg/dL (ref 8.4–10.5)
Creatinine, Ser: 0.68 mg/dL (ref 0.40–1.20)
GFR: 104.36 mL/min (ref >60.00)
Glucose, Bld: 117 mg/dL — ABNORMAL HIGH (ref 70–99)
POTASSIUM: 3.6 meq/L (ref 3.5–5.1)
Sodium: 140 mEq/L (ref 135–145)
TOTAL PROTEIN: 6.6 g/dL (ref 6.0–8.3)
Total Bilirubin: 0.5 mg/dL (ref 0.2–1.2)

## 2017-10-01 LAB — HEMOGLOBIN A1C: HEMOGLOBIN A1C: 6 % (ref 4.6–6.5)

## 2017-10-01 LAB — LDL CHOLESTEROL, DIRECT: Direct LDL: 206 mg/dL

## 2017-10-01 LAB — TSH: TSH: 3.97 u[IU]/mL (ref 0.35–4.50)

## 2017-10-01 MED ORDER — LEVOTHYROXINE SODIUM 175 MCG PO TABS: ORAL_TABLET | ORAL | 3 refills | Status: AC

## 2017-10-01 NOTE — Addendum Note (Signed)
Addended by: Charna ElizabethLEMMONS,  L on: 10/01/2017 08:41 AM   Modules accepted: Orders

## 2017-10-02 ENCOUNTER — Other Ambulatory Visit: Payer: Self-pay | Admitting: Family Medicine

## 2017-10-02 LAB — HIV ANTIBODY (ROUTINE TESTING W REFLEX): HIV: NONREACTIVE

## 2017-10-02 MED ORDER — ATORVASTATIN CALCIUM 10 MG PO TABS: 10.0000 mg | ORAL_TABLET | Freq: Every day | ORAL | 3 refills | Status: AC

## 2017-10-11 IMAGING — CT CT ABD-PELV W/ CM
2 of 4 series · 17 of 46 positions shown, 19 images · IV contrast (iopamidol)
Comparison: Pelvic and abdominal ultrasound 08/29/2015.

CLINICAL DATA: Generalized lower abdominal pain and low back pain.
Nausea, diarrhea for 3 days

EXAM:
CT ABDOMEN AND PELVIS WITH CONTRAST
TECHNIQUE: Multidetector CT imaging of the abdomen and pelvis was performed
using the standard protocol following bolus administration of
intravenous contrast.
CONTRAST:  100mL OSX7PW-CXX IOPAMIDOL (OSX7PW-CXX) INJECTION 61%

[Series 2: abd/pel w · axial · 0.79mm/px · z∈[-555,-95]mm · 14 of 102 slices shown, 16 images]
[im 5/102  soft-tissue]
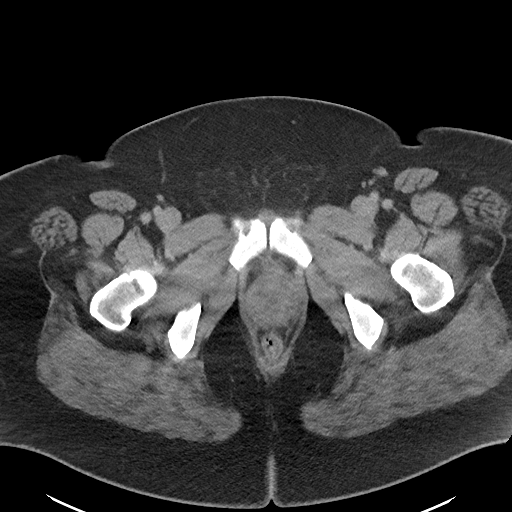
[im 5/102  bone]
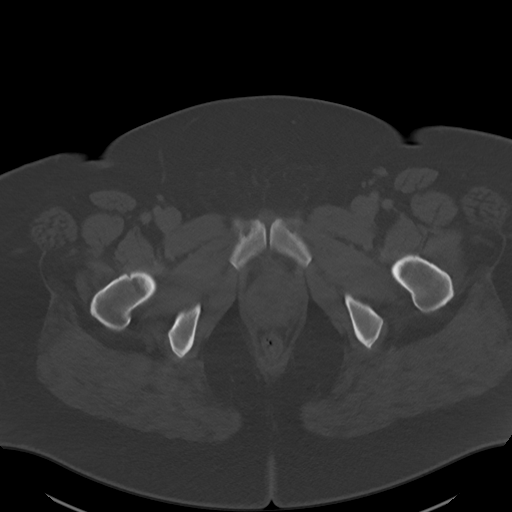
[im 13/102  soft-tissue]
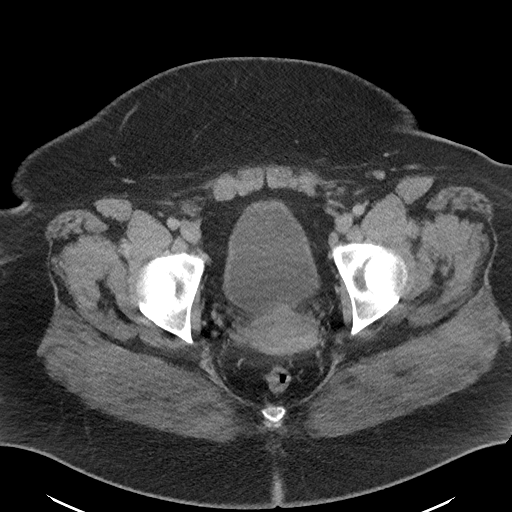
[im 22/102  soft-tissue]
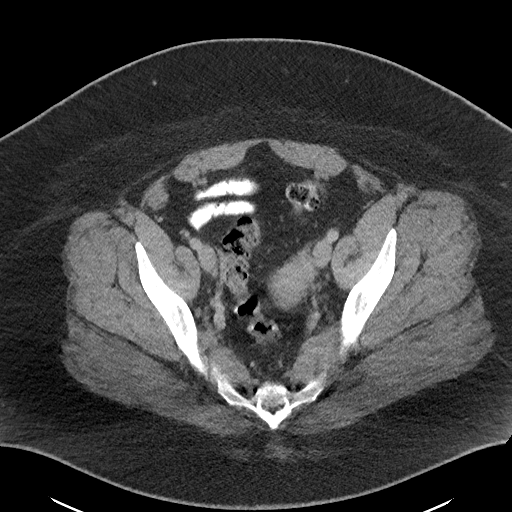
[im 26/102  soft-tissue]
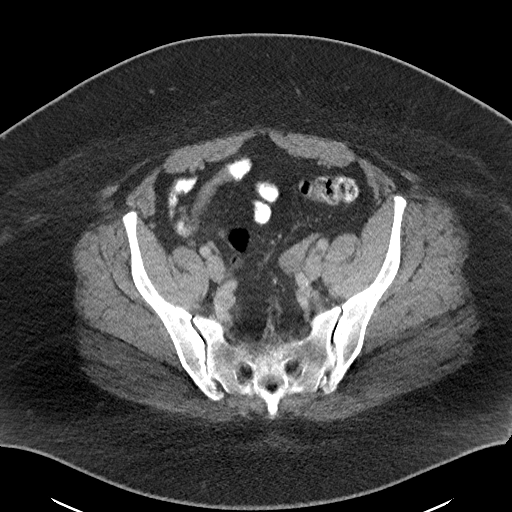
[im 34/102  soft-tissue]
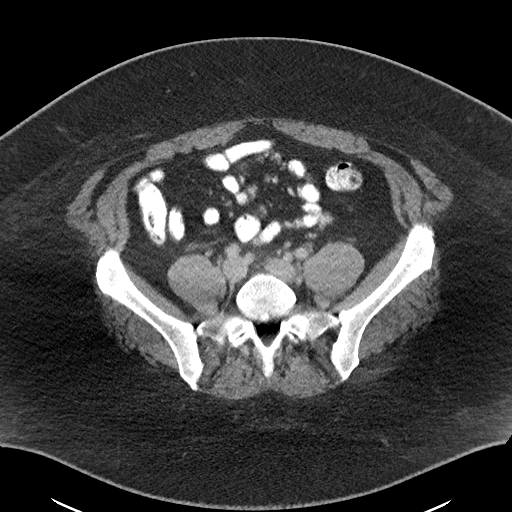
[im 43/102  soft-tissue]
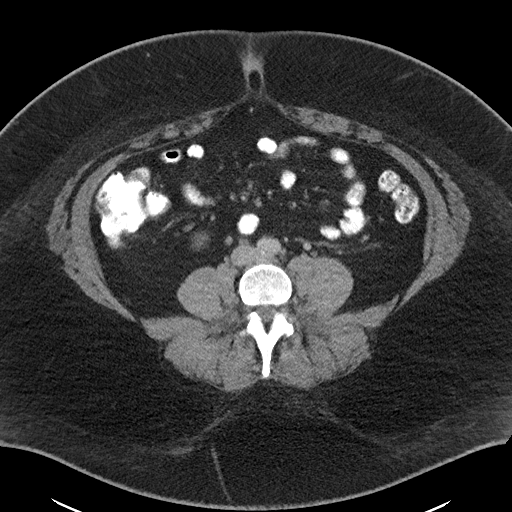
[im 47/102  soft-tissue]
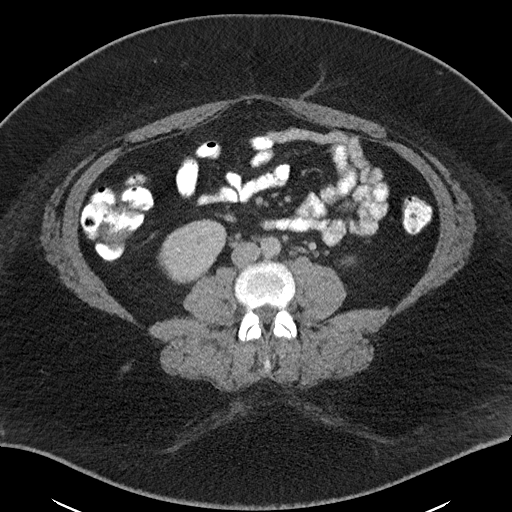
[im 55/102  soft-tissue]
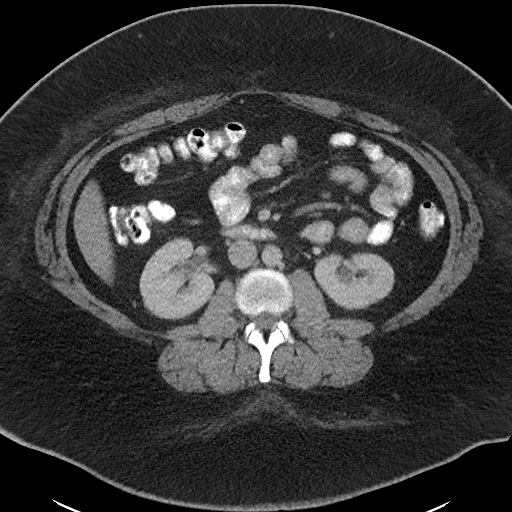
[im 59/102  soft-tissue]
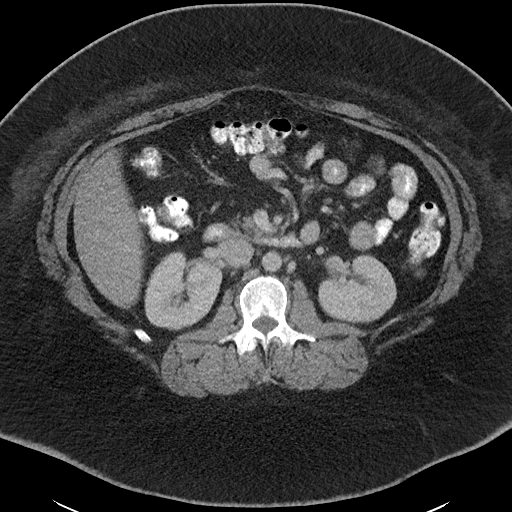
[im 59/102  bone]
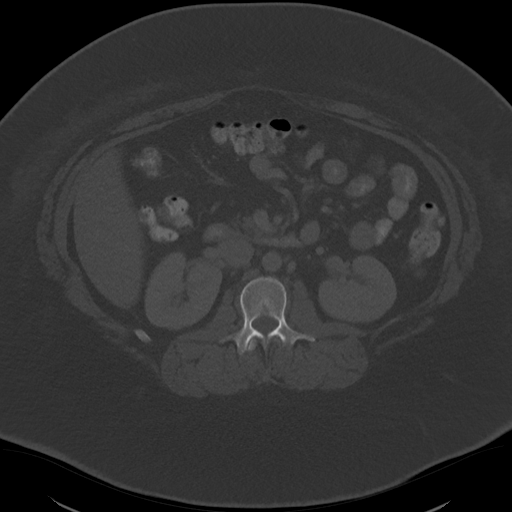
[im 68/102  soft-tissue]
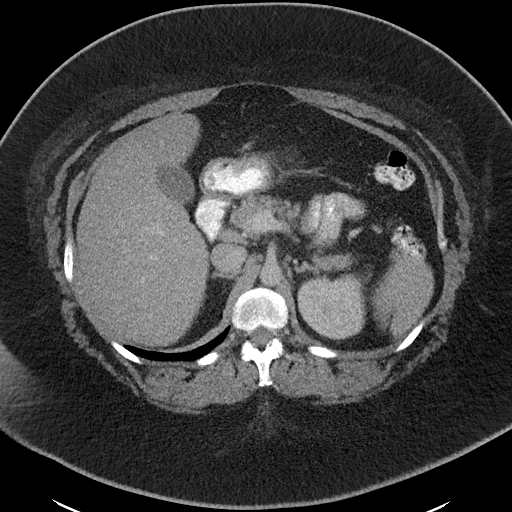
[im 76/102  soft-tissue]
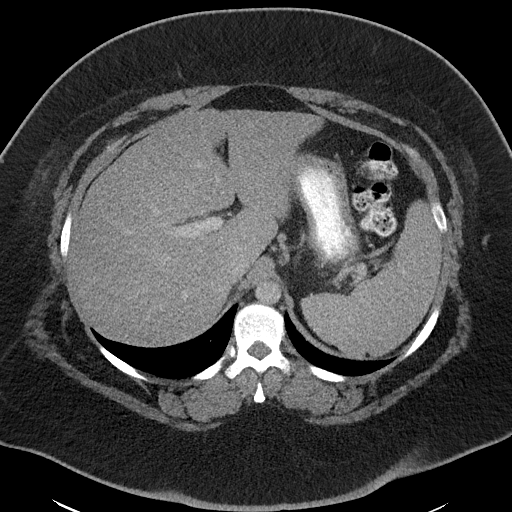
[im 80/102  soft-tissue]
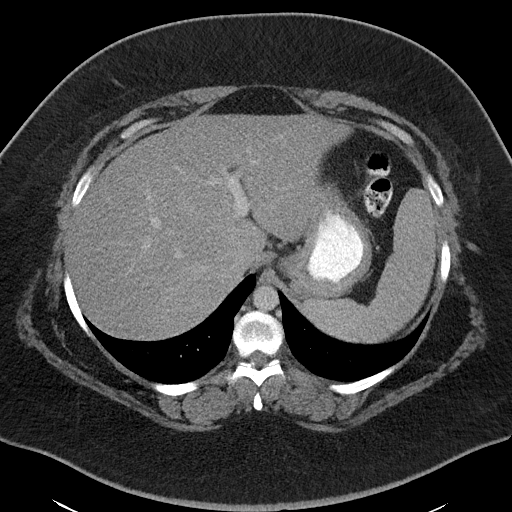
[im 89/102  soft-tissue]
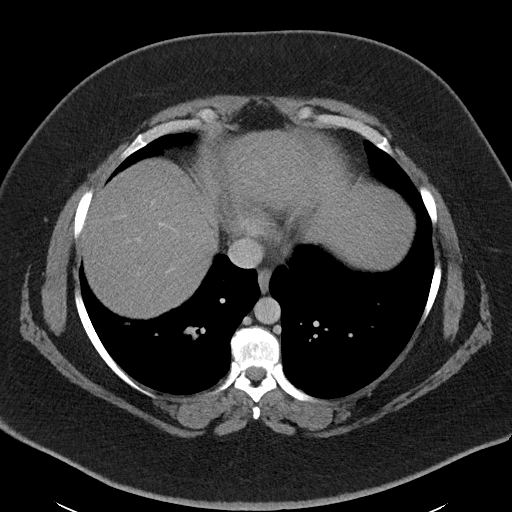
[im 97/102  soft-tissue]
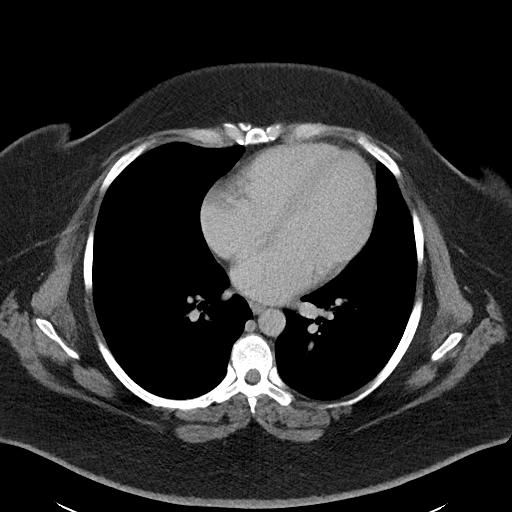

[Series 5: abd/pel w st · coronal · 0.96mm/px · 3 of 116 slices shown]
[im 39/116  soft-tissue]
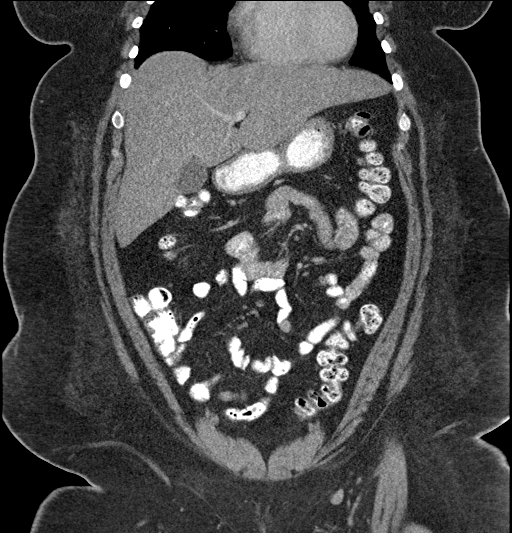
[im 52/116  soft-tissue]
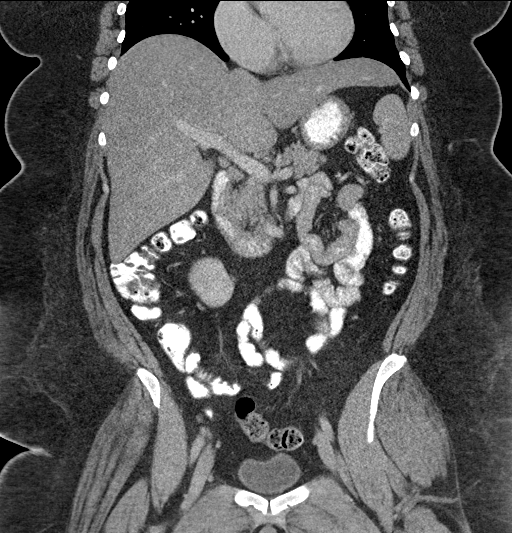
[im 64/116  soft-tissue]
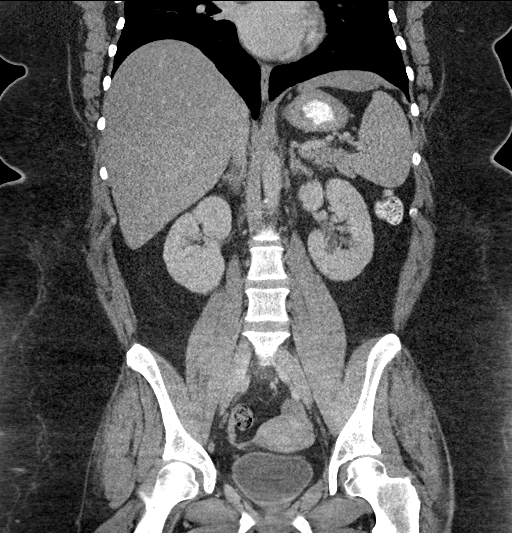

[17 of 46 positions shown; findings below may reference images not displayed]

FINDINGS: Lower chest: Lung bases are clear. No effusions. Heart is normal
size.

Hepatobiliary: Diffuse low-density throughout the liver compatible
with fatty infiltration. No focal hepatic abnormality. Gallbladder
unremarkable.

Pancreas: No focal abnormality or ductal dilatation.

Spleen: No focal abnormality.  Normal size.

Adrenals/Urinary Tract: No adrenal abnormality. No focal renal
abnormality. No stones or hydronephrosis. Urinary bladder is
unremarkable.

Stomach/Bowel: Appendix is normal. Stomach, large and small bowel
grossly unremarkable.

Vascular/Lymphatic: No evidence of aneurysm or adenopathy.

Reproductive: Uterus and adnexa unremarkable.  No mass.

Other: No free fluid or free air.

Musculoskeletal: No acute bony abnormality.
IMPRESSION: No acute findings in the abdomen or pelvis.

Diffuse fatty infiltration of the liver.
